# Patient Record
Sex: Male | Born: 1940
Health system: Southern US, Community
[De-identification: ages and names within clinical notes are randomized; demographics above are authoritative.]

## PROBLEM LIST (undated history)

## (undated) DIAGNOSIS — M109 Gout, unspecified: Secondary | ICD-10-CM

## (undated) DIAGNOSIS — I10 Essential (primary) hypertension: Secondary | ICD-10-CM

## (undated) HISTORY — PX: TONSILLECTOMY: SUR1361

---

## 2005-02-25 ENCOUNTER — Encounter: Admission: RE | Admit: 2005-02-25 | Discharge: 2005-02-25 | Payer: Self-pay | Admitting: Internal Medicine

## 2005-03-08 ENCOUNTER — Encounter: Admission: RE | Admit: 2005-03-08 | Discharge: 2005-03-08 | Payer: Self-pay | Admitting: Internal Medicine

## 2005-06-16 ENCOUNTER — Encounter: Admission: RE | Admit: 2005-06-16 | Discharge: 2005-06-16 | Payer: Self-pay | Admitting: Internal Medicine

## 2005-07-13 ENCOUNTER — Ambulatory Visit: Payer: Self-pay | Admitting: Gastroenterology

## 2005-07-25 ENCOUNTER — Ambulatory Visit: Payer: Self-pay | Admitting: Gastroenterology

## 2005-12-02 ENCOUNTER — Encounter: Admission: RE | Admit: 2005-12-02 | Discharge: 2005-12-02 | Payer: Self-pay | Admitting: Orthopedic Surgery

## 2009-05-08 ENCOUNTER — Encounter: Admission: RE | Admit: 2009-05-08 | Discharge: 2009-05-08 | Payer: Self-pay | Admitting: Orthopedic Surgery

## 2009-05-09 ENCOUNTER — Encounter: Admission: RE | Admit: 2009-05-09 | Discharge: 2009-05-09 | Payer: Self-pay | Admitting: Orthopedic Surgery

## 2009-07-15 ENCOUNTER — Encounter: Admission: RE | Admit: 2009-07-15 | Discharge: 2009-07-15 | Payer: Self-pay | Admitting: Internal Medicine

## 2011-04-01 ENCOUNTER — Encounter: Payer: Self-pay | Admitting: Gastroenterology

## 2011-04-20 ENCOUNTER — Other Ambulatory Visit: Payer: Self-pay | Admitting: Gastroenterology

## 2011-10-03 DIAGNOSIS — M79609 Pain in unspecified limb: Secondary | ICD-10-CM | POA: Diagnosis not present

## 2011-11-11 DIAGNOSIS — M25559 Pain in unspecified hip: Secondary | ICD-10-CM | POA: Diagnosis not present

## 2011-12-20 DIAGNOSIS — M25559 Pain in unspecified hip: Secondary | ICD-10-CM | POA: Diagnosis not present

## 2011-12-28 DIAGNOSIS — M76899 Other specified enthesopathies of unspecified lower limb, excluding foot: Secondary | ICD-10-CM | POA: Diagnosis not present

## 2012-01-19 DIAGNOSIS — H4011X0 Primary open-angle glaucoma, stage unspecified: Secondary | ICD-10-CM | POA: Diagnosis not present

## 2012-01-19 DIAGNOSIS — H409 Unspecified glaucoma: Secondary | ICD-10-CM | POA: Diagnosis not present

## 2012-02-03 DIAGNOSIS — M109 Gout, unspecified: Secondary | ICD-10-CM | POA: Diagnosis not present

## 2012-02-03 DIAGNOSIS — I10 Essential (primary) hypertension: Secondary | ICD-10-CM | POA: Diagnosis not present

## 2012-02-03 DIAGNOSIS — E785 Hyperlipidemia, unspecified: Secondary | ICD-10-CM | POA: Diagnosis not present

## 2012-02-03 DIAGNOSIS — Z125 Encounter for screening for malignant neoplasm of prostate: Secondary | ICD-10-CM | POA: Diagnosis not present

## 2012-02-06 DIAGNOSIS — I10 Essential (primary) hypertension: Secondary | ICD-10-CM | POA: Diagnosis not present

## 2012-02-06 DIAGNOSIS — L578 Other skin changes due to chronic exposure to nonionizing radiation: Secondary | ICD-10-CM | POA: Diagnosis not present

## 2012-02-06 DIAGNOSIS — L821 Other seborrheic keratosis: Secondary | ICD-10-CM | POA: Diagnosis not present

## 2012-02-06 DIAGNOSIS — L570 Actinic keratosis: Secondary | ICD-10-CM | POA: Diagnosis not present

## 2012-02-06 DIAGNOSIS — B079 Viral wart, unspecified: Secondary | ICD-10-CM | POA: Diagnosis not present

## 2012-02-06 DIAGNOSIS — D239 Other benign neoplasm of skin, unspecified: Secondary | ICD-10-CM | POA: Diagnosis not present

## 2012-02-10 DIAGNOSIS — Z0000 Encounter for general adult medical examination without abnormal findings: Secondary | ICD-10-CM | POA: Diagnosis not present

## 2012-02-10 DIAGNOSIS — R7989 Other specified abnormal findings of blood chemistry: Secondary | ICD-10-CM | POA: Diagnosis not present

## 2012-02-10 DIAGNOSIS — D239 Other benign neoplasm of skin, unspecified: Secondary | ICD-10-CM | POA: Diagnosis not present

## 2012-02-10 DIAGNOSIS — I10 Essential (primary) hypertension: Secondary | ICD-10-CM | POA: Diagnosis not present

## 2012-02-10 DIAGNOSIS — I4510 Unspecified right bundle-branch block: Secondary | ICD-10-CM | POA: Diagnosis not present

## 2012-02-10 DIAGNOSIS — M1990 Unspecified osteoarthritis, unspecified site: Secondary | ICD-10-CM | POA: Diagnosis not present

## 2012-02-10 DIAGNOSIS — N401 Enlarged prostate with lower urinary tract symptoms: Secondary | ICD-10-CM | POA: Diagnosis not present

## 2012-02-10 DIAGNOSIS — Z125 Encounter for screening for malignant neoplasm of prostate: Secondary | ICD-10-CM | POA: Diagnosis not present

## 2012-06-14 DIAGNOSIS — M109 Gout, unspecified: Secondary | ICD-10-CM | POA: Diagnosis not present

## 2012-06-14 DIAGNOSIS — N529 Male erectile dysfunction, unspecified: Secondary | ICD-10-CM | POA: Diagnosis not present

## 2012-06-14 DIAGNOSIS — E291 Testicular hypofunction: Secondary | ICD-10-CM | POA: Diagnosis not present

## 2012-06-14 DIAGNOSIS — Z23 Encounter for immunization: Secondary | ICD-10-CM | POA: Diagnosis not present

## 2012-08-14 DIAGNOSIS — Z1331 Encounter for screening for depression: Secondary | ICD-10-CM | POA: Diagnosis not present

## 2012-08-14 DIAGNOSIS — K219 Gastro-esophageal reflux disease without esophagitis: Secondary | ICD-10-CM | POA: Diagnosis not present

## 2012-08-14 DIAGNOSIS — M1990 Unspecified osteoarthritis, unspecified site: Secondary | ICD-10-CM | POA: Diagnosis not present

## 2012-08-14 DIAGNOSIS — N529 Male erectile dysfunction, unspecified: Secondary | ICD-10-CM | POA: Diagnosis not present

## 2012-09-03 DIAGNOSIS — H2510 Age-related nuclear cataract, unspecified eye: Secondary | ICD-10-CM | POA: Diagnosis not present

## 2012-11-16 DIAGNOSIS — I10 Essential (primary) hypertension: Secondary | ICD-10-CM | POA: Diagnosis not present

## 2012-11-16 DIAGNOSIS — N401 Enlarged prostate with lower urinary tract symptoms: Secondary | ICD-10-CM | POA: Diagnosis not present

## 2012-11-16 DIAGNOSIS — Z733 Stress, not elsewhere classified: Secondary | ICD-10-CM | POA: Diagnosis not present

## 2012-11-16 DIAGNOSIS — R5381 Other malaise: Secondary | ICD-10-CM | POA: Diagnosis not present

## 2012-12-10 DIAGNOSIS — D239 Other benign neoplasm of skin, unspecified: Secondary | ICD-10-CM | POA: Diagnosis not present

## 2012-12-10 DIAGNOSIS — L821 Other seborrheic keratosis: Secondary | ICD-10-CM | POA: Diagnosis not present

## 2012-12-10 DIAGNOSIS — L723 Sebaceous cyst: Secondary | ICD-10-CM | POA: Diagnosis not present

## 2012-12-10 DIAGNOSIS — L819 Disorder of pigmentation, unspecified: Secondary | ICD-10-CM | POA: Diagnosis not present

## 2012-12-10 DIAGNOSIS — Z85828 Personal history of other malignant neoplasm of skin: Secondary | ICD-10-CM | POA: Diagnosis not present

## 2012-12-10 DIAGNOSIS — D1801 Hemangioma of skin and subcutaneous tissue: Secondary | ICD-10-CM | POA: Diagnosis not present

## 2012-12-10 DIAGNOSIS — L739 Follicular disorder, unspecified: Secondary | ICD-10-CM | POA: Diagnosis not present

## 2012-12-10 DIAGNOSIS — L259 Unspecified contact dermatitis, unspecified cause: Secondary | ICD-10-CM | POA: Diagnosis not present

## 2013-01-29 DIAGNOSIS — N529 Male erectile dysfunction, unspecified: Secondary | ICD-10-CM | POA: Diagnosis not present

## 2013-02-04 DIAGNOSIS — E785 Hyperlipidemia, unspecified: Secondary | ICD-10-CM | POA: Diagnosis not present

## 2013-02-04 DIAGNOSIS — M109 Gout, unspecified: Secondary | ICD-10-CM | POA: Diagnosis not present

## 2013-02-04 DIAGNOSIS — I10 Essential (primary) hypertension: Secondary | ICD-10-CM | POA: Diagnosis not present

## 2013-02-04 DIAGNOSIS — N529 Male erectile dysfunction, unspecified: Secondary | ICD-10-CM | POA: Diagnosis not present

## 2013-02-04 DIAGNOSIS — Z125 Encounter for screening for malignant neoplasm of prostate: Secondary | ICD-10-CM | POA: Diagnosis not present

## 2013-02-13 DIAGNOSIS — Z125 Encounter for screening for malignant neoplasm of prostate: Secondary | ICD-10-CM | POA: Diagnosis not present

## 2013-02-13 DIAGNOSIS — Z0000 Encounter for general adult medical examination without abnormal findings: Secondary | ICD-10-CM | POA: Diagnosis not present

## 2013-02-13 DIAGNOSIS — M1990 Unspecified osteoarthritis, unspecified site: Secondary | ICD-10-CM | POA: Diagnosis not present

## 2013-02-13 DIAGNOSIS — N529 Male erectile dysfunction, unspecified: Secondary | ICD-10-CM | POA: Diagnosis not present

## 2013-02-13 DIAGNOSIS — M109 Gout, unspecified: Secondary | ICD-10-CM | POA: Diagnosis not present

## 2013-02-13 DIAGNOSIS — E785 Hyperlipidemia, unspecified: Secondary | ICD-10-CM | POA: Diagnosis not present

## 2013-02-13 DIAGNOSIS — K219 Gastro-esophageal reflux disease without esophagitis: Secondary | ICD-10-CM | POA: Diagnosis not present

## 2013-02-13 DIAGNOSIS — I10 Essential (primary) hypertension: Secondary | ICD-10-CM | POA: Diagnosis not present

## 2013-02-26 DIAGNOSIS — N529 Male erectile dysfunction, unspecified: Secondary | ICD-10-CM | POA: Diagnosis not present

## 2013-03-11 DIAGNOSIS — H409 Unspecified glaucoma: Secondary | ICD-10-CM | POA: Diagnosis not present

## 2013-03-11 DIAGNOSIS — H4011X0 Primary open-angle glaucoma, stage unspecified: Secondary | ICD-10-CM | POA: Diagnosis not present

## 2013-04-17 DIAGNOSIS — M545 Low back pain: Secondary | ICD-10-CM | POA: Diagnosis not present

## 2013-04-17 DIAGNOSIS — M9981 Other biomechanical lesions of cervical region: Secondary | ICD-10-CM | POA: Diagnosis not present

## 2013-04-17 DIAGNOSIS — M542 Cervicalgia: Secondary | ICD-10-CM | POA: Diagnosis not present

## 2013-04-17 DIAGNOSIS — M999 Biomechanical lesion, unspecified: Secondary | ICD-10-CM | POA: Diagnosis not present

## 2013-04-18 DIAGNOSIS — M542 Cervicalgia: Secondary | ICD-10-CM | POA: Diagnosis not present

## 2013-04-18 DIAGNOSIS — M9981 Other biomechanical lesions of cervical region: Secondary | ICD-10-CM | POA: Diagnosis not present

## 2013-04-18 DIAGNOSIS — M545 Low back pain: Secondary | ICD-10-CM | POA: Diagnosis not present

## 2013-04-18 DIAGNOSIS — M999 Biomechanical lesion, unspecified: Secondary | ICD-10-CM | POA: Diagnosis not present

## 2013-04-22 DIAGNOSIS — M999 Biomechanical lesion, unspecified: Secondary | ICD-10-CM | POA: Diagnosis not present

## 2013-04-22 DIAGNOSIS — M542 Cervicalgia: Secondary | ICD-10-CM | POA: Diagnosis not present

## 2013-04-22 DIAGNOSIS — M9981 Other biomechanical lesions of cervical region: Secondary | ICD-10-CM | POA: Diagnosis not present

## 2013-04-22 DIAGNOSIS — M545 Low back pain: Secondary | ICD-10-CM | POA: Diagnosis not present

## 2013-04-25 DIAGNOSIS — M545 Low back pain: Secondary | ICD-10-CM | POA: Diagnosis not present

## 2013-04-25 DIAGNOSIS — M542 Cervicalgia: Secondary | ICD-10-CM | POA: Diagnosis not present

## 2013-04-25 DIAGNOSIS — M999 Biomechanical lesion, unspecified: Secondary | ICD-10-CM | POA: Diagnosis not present

## 2013-04-25 DIAGNOSIS — M9981 Other biomechanical lesions of cervical region: Secondary | ICD-10-CM | POA: Diagnosis not present

## 2013-04-29 DIAGNOSIS — H4011X0 Primary open-angle glaucoma, stage unspecified: Secondary | ICD-10-CM | POA: Diagnosis not present

## 2013-04-29 DIAGNOSIS — H409 Unspecified glaucoma: Secondary | ICD-10-CM | POA: Diagnosis not present

## 2013-04-29 DIAGNOSIS — H2510 Age-related nuclear cataract, unspecified eye: Secondary | ICD-10-CM | POA: Diagnosis not present

## 2013-04-30 DIAGNOSIS — M542 Cervicalgia: Secondary | ICD-10-CM | POA: Diagnosis not present

## 2013-04-30 DIAGNOSIS — M9981 Other biomechanical lesions of cervical region: Secondary | ICD-10-CM | POA: Diagnosis not present

## 2013-04-30 DIAGNOSIS — N529 Male erectile dysfunction, unspecified: Secondary | ICD-10-CM | POA: Diagnosis not present

## 2013-04-30 DIAGNOSIS — M999 Biomechanical lesion, unspecified: Secondary | ICD-10-CM | POA: Diagnosis not present

## 2013-04-30 DIAGNOSIS — M545 Low back pain: Secondary | ICD-10-CM | POA: Diagnosis not present

## 2013-04-30 DIAGNOSIS — N401 Enlarged prostate with lower urinary tract symptoms: Secondary | ICD-10-CM | POA: Diagnosis not present

## 2013-05-02 DIAGNOSIS — M999 Biomechanical lesion, unspecified: Secondary | ICD-10-CM | POA: Diagnosis not present

## 2013-05-02 DIAGNOSIS — M545 Low back pain: Secondary | ICD-10-CM | POA: Diagnosis not present

## 2013-05-02 DIAGNOSIS — M9981 Other biomechanical lesions of cervical region: Secondary | ICD-10-CM | POA: Diagnosis not present

## 2013-05-02 DIAGNOSIS — M542 Cervicalgia: Secondary | ICD-10-CM | POA: Diagnosis not present

## 2013-05-06 DIAGNOSIS — M545 Low back pain: Secondary | ICD-10-CM | POA: Diagnosis not present

## 2013-05-06 DIAGNOSIS — M9981 Other biomechanical lesions of cervical region: Secondary | ICD-10-CM | POA: Diagnosis not present

## 2013-05-06 DIAGNOSIS — M542 Cervicalgia: Secondary | ICD-10-CM | POA: Diagnosis not present

## 2013-05-06 DIAGNOSIS — M999 Biomechanical lesion, unspecified: Secondary | ICD-10-CM | POA: Diagnosis not present

## 2013-05-09 DIAGNOSIS — M545 Low back pain: Secondary | ICD-10-CM | POA: Diagnosis not present

## 2013-05-09 DIAGNOSIS — M999 Biomechanical lesion, unspecified: Secondary | ICD-10-CM | POA: Diagnosis not present

## 2013-05-09 DIAGNOSIS — M9981 Other biomechanical lesions of cervical region: Secondary | ICD-10-CM | POA: Diagnosis not present

## 2013-05-09 DIAGNOSIS — M542 Cervicalgia: Secondary | ICD-10-CM | POA: Diagnosis not present

## 2013-05-13 DIAGNOSIS — M542 Cervicalgia: Secondary | ICD-10-CM | POA: Diagnosis not present

## 2013-05-13 DIAGNOSIS — M545 Low back pain: Secondary | ICD-10-CM | POA: Diagnosis not present

## 2013-05-13 DIAGNOSIS — M999 Biomechanical lesion, unspecified: Secondary | ICD-10-CM | POA: Diagnosis not present

## 2013-05-13 DIAGNOSIS — M9981 Other biomechanical lesions of cervical region: Secondary | ICD-10-CM | POA: Diagnosis not present

## 2013-05-16 DIAGNOSIS — M545 Low back pain: Secondary | ICD-10-CM | POA: Diagnosis not present

## 2013-05-16 DIAGNOSIS — M9981 Other biomechanical lesions of cervical region: Secondary | ICD-10-CM | POA: Diagnosis not present

## 2013-05-16 DIAGNOSIS — M542 Cervicalgia: Secondary | ICD-10-CM | POA: Diagnosis not present

## 2013-05-16 DIAGNOSIS — M999 Biomechanical lesion, unspecified: Secondary | ICD-10-CM | POA: Diagnosis not present

## 2013-05-21 DIAGNOSIS — M999 Biomechanical lesion, unspecified: Secondary | ICD-10-CM | POA: Diagnosis not present

## 2013-05-21 DIAGNOSIS — M9981 Other biomechanical lesions of cervical region: Secondary | ICD-10-CM | POA: Diagnosis not present

## 2013-05-21 DIAGNOSIS — M545 Low back pain: Secondary | ICD-10-CM | POA: Diagnosis not present

## 2013-05-21 DIAGNOSIS — M542 Cervicalgia: Secondary | ICD-10-CM | POA: Diagnosis not present

## 2013-05-23 DIAGNOSIS — M999 Biomechanical lesion, unspecified: Secondary | ICD-10-CM | POA: Diagnosis not present

## 2013-05-23 DIAGNOSIS — M545 Low back pain: Secondary | ICD-10-CM | POA: Diagnosis not present

## 2013-05-23 DIAGNOSIS — M542 Cervicalgia: Secondary | ICD-10-CM | POA: Diagnosis not present

## 2013-05-23 DIAGNOSIS — M9981 Other biomechanical lesions of cervical region: Secondary | ICD-10-CM | POA: Diagnosis not present

## 2013-05-27 DIAGNOSIS — M9981 Other biomechanical lesions of cervical region: Secondary | ICD-10-CM | POA: Diagnosis not present

## 2013-05-27 DIAGNOSIS — M542 Cervicalgia: Secondary | ICD-10-CM | POA: Diagnosis not present

## 2013-05-27 DIAGNOSIS — M999 Biomechanical lesion, unspecified: Secondary | ICD-10-CM | POA: Diagnosis not present

## 2013-05-27 DIAGNOSIS — M545 Low back pain: Secondary | ICD-10-CM | POA: Diagnosis not present

## 2013-05-30 DIAGNOSIS — M545 Low back pain: Secondary | ICD-10-CM | POA: Diagnosis not present

## 2013-05-30 DIAGNOSIS — M999 Biomechanical lesion, unspecified: Secondary | ICD-10-CM | POA: Diagnosis not present

## 2013-05-30 DIAGNOSIS — M9981 Other biomechanical lesions of cervical region: Secondary | ICD-10-CM | POA: Diagnosis not present

## 2013-05-30 DIAGNOSIS — M542 Cervicalgia: Secondary | ICD-10-CM | POA: Diagnosis not present

## 2013-06-03 DIAGNOSIS — M999 Biomechanical lesion, unspecified: Secondary | ICD-10-CM | POA: Diagnosis not present

## 2013-06-03 DIAGNOSIS — M542 Cervicalgia: Secondary | ICD-10-CM | POA: Diagnosis not present

## 2013-06-03 DIAGNOSIS — M545 Low back pain: Secondary | ICD-10-CM | POA: Diagnosis not present

## 2013-06-03 DIAGNOSIS — M9981 Other biomechanical lesions of cervical region: Secondary | ICD-10-CM | POA: Diagnosis not present

## 2013-06-06 DIAGNOSIS — M999 Biomechanical lesion, unspecified: Secondary | ICD-10-CM | POA: Diagnosis not present

## 2013-06-06 DIAGNOSIS — M542 Cervicalgia: Secondary | ICD-10-CM | POA: Diagnosis not present

## 2013-06-06 DIAGNOSIS — M9981 Other biomechanical lesions of cervical region: Secondary | ICD-10-CM | POA: Diagnosis not present

## 2013-06-06 DIAGNOSIS — M545 Low back pain: Secondary | ICD-10-CM | POA: Diagnosis not present

## 2013-06-10 DIAGNOSIS — M999 Biomechanical lesion, unspecified: Secondary | ICD-10-CM | POA: Diagnosis not present

## 2013-06-10 DIAGNOSIS — M542 Cervicalgia: Secondary | ICD-10-CM | POA: Diagnosis not present

## 2013-06-10 DIAGNOSIS — M545 Low back pain: Secondary | ICD-10-CM | POA: Diagnosis not present

## 2013-06-10 DIAGNOSIS — M9981 Other biomechanical lesions of cervical region: Secondary | ICD-10-CM | POA: Diagnosis not present

## 2013-06-13 DIAGNOSIS — M9981 Other biomechanical lesions of cervical region: Secondary | ICD-10-CM | POA: Diagnosis not present

## 2013-06-13 DIAGNOSIS — M999 Biomechanical lesion, unspecified: Secondary | ICD-10-CM | POA: Diagnosis not present

## 2013-06-13 DIAGNOSIS — M542 Cervicalgia: Secondary | ICD-10-CM | POA: Diagnosis not present

## 2013-06-13 DIAGNOSIS — M545 Low back pain: Secondary | ICD-10-CM | POA: Diagnosis not present

## 2013-06-20 DIAGNOSIS — M47812 Spondylosis without myelopathy or radiculopathy, cervical region: Secondary | ICD-10-CM | POA: Diagnosis not present

## 2013-06-20 DIAGNOSIS — M9981 Other biomechanical lesions of cervical region: Secondary | ICD-10-CM | POA: Diagnosis not present

## 2013-06-20 DIAGNOSIS — M542 Cervicalgia: Secondary | ICD-10-CM | POA: Diagnosis not present

## 2013-06-27 DIAGNOSIS — M9981 Other biomechanical lesions of cervical region: Secondary | ICD-10-CM | POA: Diagnosis not present

## 2013-06-27 DIAGNOSIS — M47812 Spondylosis without myelopathy or radiculopathy, cervical region: Secondary | ICD-10-CM | POA: Diagnosis not present

## 2013-06-27 DIAGNOSIS — M542 Cervicalgia: Secondary | ICD-10-CM | POA: Diagnosis not present

## 2013-07-03 DIAGNOSIS — M1990 Unspecified osteoarthritis, unspecified site: Secondary | ICD-10-CM | POA: Diagnosis not present

## 2013-07-03 DIAGNOSIS — M6740 Ganglion, unspecified site: Secondary | ICD-10-CM | POA: Diagnosis not present

## 2013-07-04 DIAGNOSIS — M47812 Spondylosis without myelopathy or radiculopathy, cervical region: Secondary | ICD-10-CM | POA: Diagnosis not present

## 2013-07-04 DIAGNOSIS — M542 Cervicalgia: Secondary | ICD-10-CM | POA: Diagnosis not present

## 2013-07-04 DIAGNOSIS — M9981 Other biomechanical lesions of cervical region: Secondary | ICD-10-CM | POA: Diagnosis not present

## 2013-07-11 DIAGNOSIS — M47812 Spondylosis without myelopathy or radiculopathy, cervical region: Secondary | ICD-10-CM | POA: Diagnosis not present

## 2013-07-11 DIAGNOSIS — M542 Cervicalgia: Secondary | ICD-10-CM | POA: Diagnosis not present

## 2013-07-11 DIAGNOSIS — M9981 Other biomechanical lesions of cervical region: Secondary | ICD-10-CM | POA: Diagnosis not present

## 2013-07-23 DIAGNOSIS — M542 Cervicalgia: Secondary | ICD-10-CM | POA: Diagnosis not present

## 2013-07-23 DIAGNOSIS — M47812 Spondylosis without myelopathy or radiculopathy, cervical region: Secondary | ICD-10-CM | POA: Diagnosis not present

## 2013-07-23 DIAGNOSIS — M9981 Other biomechanical lesions of cervical region: Secondary | ICD-10-CM | POA: Diagnosis not present

## 2013-08-07 DIAGNOSIS — N139 Obstructive and reflux uropathy, unspecified: Secondary | ICD-10-CM | POA: Diagnosis not present

## 2013-08-07 DIAGNOSIS — R351 Nocturia: Secondary | ICD-10-CM | POA: Diagnosis not present

## 2013-08-07 DIAGNOSIS — R3915 Urgency of urination: Secondary | ICD-10-CM | POA: Diagnosis not present

## 2013-08-07 DIAGNOSIS — N401 Enlarged prostate with lower urinary tract symptoms: Secondary | ICD-10-CM | POA: Diagnosis not present

## 2013-08-08 DIAGNOSIS — M542 Cervicalgia: Secondary | ICD-10-CM | POA: Diagnosis not present

## 2013-08-08 DIAGNOSIS — M47812 Spondylosis without myelopathy or radiculopathy, cervical region: Secondary | ICD-10-CM | POA: Diagnosis not present

## 2013-08-08 DIAGNOSIS — M9981 Other biomechanical lesions of cervical region: Secondary | ICD-10-CM | POA: Diagnosis not present

## 2013-08-19 DIAGNOSIS — M9981 Other biomechanical lesions of cervical region: Secondary | ICD-10-CM | POA: Diagnosis not present

## 2013-08-19 DIAGNOSIS — M47812 Spondylosis without myelopathy or radiculopathy, cervical region: Secondary | ICD-10-CM | POA: Diagnosis not present

## 2013-08-19 DIAGNOSIS — M542 Cervicalgia: Secondary | ICD-10-CM | POA: Diagnosis not present

## 2013-08-20 DIAGNOSIS — M1990 Unspecified osteoarthritis, unspecified site: Secondary | ICD-10-CM | POA: Diagnosis not present

## 2013-08-20 DIAGNOSIS — Z6831 Body mass index (BMI) 31.0-31.9, adult: Secondary | ICD-10-CM | POA: Diagnosis not present

## 2013-08-20 DIAGNOSIS — I10 Essential (primary) hypertension: Secondary | ICD-10-CM | POA: Diagnosis not present

## 2013-08-20 DIAGNOSIS — K219 Gastro-esophageal reflux disease without esophagitis: Secondary | ICD-10-CM | POA: Diagnosis not present

## 2013-08-20 DIAGNOSIS — G5600 Carpal tunnel syndrome, unspecified upper limb: Secondary | ICD-10-CM | POA: Diagnosis not present

## 2013-08-20 DIAGNOSIS — E785 Hyperlipidemia, unspecified: Secondary | ICD-10-CM | POA: Diagnosis not present

## 2013-08-20 DIAGNOSIS — M109 Gout, unspecified: Secondary | ICD-10-CM | POA: Diagnosis not present

## 2013-08-29 DIAGNOSIS — H4011X0 Primary open-angle glaucoma, stage unspecified: Secondary | ICD-10-CM | POA: Diagnosis not present

## 2013-08-29 DIAGNOSIS — H409 Unspecified glaucoma: Secondary | ICD-10-CM | POA: Diagnosis not present

## 2013-08-29 DIAGNOSIS — H2510 Age-related nuclear cataract, unspecified eye: Secondary | ICD-10-CM | POA: Diagnosis not present

## 2013-09-02 DIAGNOSIS — M5430 Sciatica, unspecified side: Secondary | ICD-10-CM | POA: Diagnosis not present

## 2013-09-02 DIAGNOSIS — M545 Low back pain: Secondary | ICD-10-CM | POA: Diagnosis not present

## 2013-09-02 DIAGNOSIS — M999 Biomechanical lesion, unspecified: Secondary | ICD-10-CM | POA: Diagnosis not present

## 2013-09-23 DIAGNOSIS — M999 Biomechanical lesion, unspecified: Secondary | ICD-10-CM | POA: Diagnosis not present

## 2013-09-23 DIAGNOSIS — M5430 Sciatica, unspecified side: Secondary | ICD-10-CM | POA: Diagnosis not present

## 2013-09-23 DIAGNOSIS — M545 Low back pain, unspecified: Secondary | ICD-10-CM | POA: Diagnosis not present

## 2013-09-26 DIAGNOSIS — M545 Low back pain, unspecified: Secondary | ICD-10-CM | POA: Diagnosis not present

## 2013-09-26 DIAGNOSIS — M999 Biomechanical lesion, unspecified: Secondary | ICD-10-CM | POA: Diagnosis not present

## 2013-09-26 DIAGNOSIS — M5430 Sciatica, unspecified side: Secondary | ICD-10-CM | POA: Diagnosis not present

## 2013-10-10 DIAGNOSIS — M999 Biomechanical lesion, unspecified: Secondary | ICD-10-CM | POA: Diagnosis not present

## 2013-10-10 DIAGNOSIS — M545 Low back pain, unspecified: Secondary | ICD-10-CM | POA: Diagnosis not present

## 2013-10-10 DIAGNOSIS — M5430 Sciatica, unspecified side: Secondary | ICD-10-CM | POA: Diagnosis not present

## 2013-11-28 DIAGNOSIS — M542 Cervicalgia: Secondary | ICD-10-CM | POA: Diagnosis not present

## 2013-11-28 DIAGNOSIS — M9981 Other biomechanical lesions of cervical region: Secondary | ICD-10-CM | POA: Diagnosis not present

## 2013-11-28 DIAGNOSIS — M5030 Other cervical disc degeneration, unspecified cervical region: Secondary | ICD-10-CM | POA: Diagnosis not present

## 2013-11-29 DIAGNOSIS — N139 Obstructive and reflux uropathy, unspecified: Secondary | ICD-10-CM | POA: Diagnosis not present

## 2013-11-29 DIAGNOSIS — N401 Enlarged prostate with lower urinary tract symptoms: Secondary | ICD-10-CM | POA: Diagnosis not present

## 2013-12-05 DIAGNOSIS — M9981 Other biomechanical lesions of cervical region: Secondary | ICD-10-CM | POA: Diagnosis not present

## 2013-12-05 DIAGNOSIS — M5030 Other cervical disc degeneration, unspecified cervical region: Secondary | ICD-10-CM | POA: Diagnosis not present

## 2013-12-05 DIAGNOSIS — M542 Cervicalgia: Secondary | ICD-10-CM | POA: Diagnosis not present

## 2013-12-11 DIAGNOSIS — L219 Seborrheic dermatitis, unspecified: Secondary | ICD-10-CM | POA: Diagnosis not present

## 2013-12-11 DIAGNOSIS — L723 Sebaceous cyst: Secondary | ICD-10-CM | POA: Diagnosis not present

## 2013-12-11 DIAGNOSIS — Z85828 Personal history of other malignant neoplasm of skin: Secondary | ICD-10-CM | POA: Diagnosis not present

## 2013-12-11 DIAGNOSIS — D485 Neoplasm of uncertain behavior of skin: Secondary | ICD-10-CM | POA: Diagnosis not present

## 2013-12-11 DIAGNOSIS — L570 Actinic keratosis: Secondary | ICD-10-CM | POA: Diagnosis not present

## 2013-12-11 DIAGNOSIS — D239 Other benign neoplasm of skin, unspecified: Secondary | ICD-10-CM | POA: Diagnosis not present

## 2013-12-11 DIAGNOSIS — L821 Other seborrheic keratosis: Secondary | ICD-10-CM | POA: Diagnosis not present

## 2013-12-12 DIAGNOSIS — M5030 Other cervical disc degeneration, unspecified cervical region: Secondary | ICD-10-CM | POA: Diagnosis not present

## 2013-12-12 DIAGNOSIS — M9981 Other biomechanical lesions of cervical region: Secondary | ICD-10-CM | POA: Diagnosis not present

## 2013-12-12 DIAGNOSIS — M542 Cervicalgia: Secondary | ICD-10-CM | POA: Diagnosis not present

## 2014-01-09 DIAGNOSIS — H4011X0 Primary open-angle glaucoma, stage unspecified: Secondary | ICD-10-CM | POA: Diagnosis not present

## 2014-01-09 DIAGNOSIS — H409 Unspecified glaucoma: Secondary | ICD-10-CM | POA: Diagnosis not present

## 2014-01-10 DIAGNOSIS — R351 Nocturia: Secondary | ICD-10-CM | POA: Diagnosis not present

## 2014-01-10 DIAGNOSIS — N401 Enlarged prostate with lower urinary tract symptoms: Secondary | ICD-10-CM | POA: Diagnosis not present

## 2014-01-10 DIAGNOSIS — R3915 Urgency of urination: Secondary | ICD-10-CM | POA: Diagnosis not present

## 2014-02-17 DIAGNOSIS — N401 Enlarged prostate with lower urinary tract symptoms: Secondary | ICD-10-CM | POA: Diagnosis not present

## 2014-02-17 DIAGNOSIS — I10 Essential (primary) hypertension: Secondary | ICD-10-CM | POA: Diagnosis not present

## 2014-02-17 DIAGNOSIS — N138 Other obstructive and reflux uropathy: Secondary | ICD-10-CM | POA: Diagnosis not present

## 2014-02-17 DIAGNOSIS — Z125 Encounter for screening for malignant neoplasm of prostate: Secondary | ICD-10-CM | POA: Diagnosis not present

## 2014-02-17 DIAGNOSIS — M109 Gout, unspecified: Secondary | ICD-10-CM | POA: Diagnosis not present

## 2014-02-17 DIAGNOSIS — E785 Hyperlipidemia, unspecified: Secondary | ICD-10-CM | POA: Diagnosis not present

## 2014-02-21 DIAGNOSIS — E785 Hyperlipidemia, unspecified: Secondary | ICD-10-CM | POA: Diagnosis not present

## 2014-02-21 DIAGNOSIS — Z1331 Encounter for screening for depression: Secondary | ICD-10-CM | POA: Diagnosis not present

## 2014-02-21 DIAGNOSIS — Z0000 Encounter for general adult medical examination without abnormal findings: Secondary | ICD-10-CM | POA: Diagnosis not present

## 2014-02-21 DIAGNOSIS — I10 Essential (primary) hypertension: Secondary | ICD-10-CM | POA: Diagnosis not present

## 2014-02-21 DIAGNOSIS — Z23 Encounter for immunization: Secondary | ICD-10-CM | POA: Diagnosis not present

## 2014-02-21 DIAGNOSIS — M1990 Unspecified osteoarthritis, unspecified site: Secondary | ICD-10-CM | POA: Diagnosis not present

## 2014-02-21 DIAGNOSIS — M109 Gout, unspecified: Secondary | ICD-10-CM | POA: Diagnosis not present

## 2014-02-21 DIAGNOSIS — K219 Gastro-esophageal reflux disease without esophagitis: Secondary | ICD-10-CM | POA: Diagnosis not present

## 2014-02-21 DIAGNOSIS — N529 Male erectile dysfunction, unspecified: Secondary | ICD-10-CM | POA: Diagnosis not present

## 2014-02-21 DIAGNOSIS — I4510 Unspecified right bundle-branch block: Secondary | ICD-10-CM | POA: Diagnosis not present

## 2014-03-12 DIAGNOSIS — H409 Unspecified glaucoma: Secondary | ICD-10-CM | POA: Diagnosis not present

## 2014-03-12 DIAGNOSIS — H4011X0 Primary open-angle glaucoma, stage unspecified: Secondary | ICD-10-CM | POA: Diagnosis not present

## 2014-03-12 DIAGNOSIS — H2510 Age-related nuclear cataract, unspecified eye: Secondary | ICD-10-CM | POA: Diagnosis not present

## 2014-03-19 DIAGNOSIS — Z0000 Encounter for general adult medical examination without abnormal findings: Secondary | ICD-10-CM | POA: Diagnosis not present

## 2014-06-09 DIAGNOSIS — R350 Frequency of micturition: Secondary | ICD-10-CM | POA: Diagnosis not present

## 2014-06-09 DIAGNOSIS — N529 Male erectile dysfunction, unspecified: Secondary | ICD-10-CM | POA: Diagnosis not present

## 2014-06-09 DIAGNOSIS — R351 Nocturia: Secondary | ICD-10-CM | POA: Diagnosis not present

## 2014-06-09 DIAGNOSIS — R3915 Urgency of urination: Secondary | ICD-10-CM | POA: Diagnosis not present

## 2014-06-09 DIAGNOSIS — N401 Enlarged prostate with lower urinary tract symptoms: Secondary | ICD-10-CM | POA: Diagnosis not present

## 2014-06-12 DIAGNOSIS — H4011X0 Primary open-angle glaucoma, stage unspecified: Secondary | ICD-10-CM | POA: Diagnosis not present

## 2014-06-12 DIAGNOSIS — H2510 Age-related nuclear cataract, unspecified eye: Secondary | ICD-10-CM | POA: Diagnosis not present

## 2014-06-12 DIAGNOSIS — H409 Unspecified glaucoma: Secondary | ICD-10-CM | POA: Diagnosis not present

## 2014-06-24 DIAGNOSIS — R3915 Urgency of urination: Secondary | ICD-10-CM | POA: Diagnosis not present

## 2014-06-24 DIAGNOSIS — R351 Nocturia: Secondary | ICD-10-CM | POA: Diagnosis not present

## 2014-07-15 DIAGNOSIS — R351 Nocturia: Secondary | ICD-10-CM | POA: Diagnosis not present

## 2014-07-15 DIAGNOSIS — R3915 Urgency of urination: Secondary | ICD-10-CM | POA: Diagnosis not present

## 2014-07-15 DIAGNOSIS — R350 Frequency of micturition: Secondary | ICD-10-CM | POA: Diagnosis not present

## 2014-07-15 DIAGNOSIS — N401 Enlarged prostate with lower urinary tract symptoms: Secondary | ICD-10-CM | POA: Diagnosis not present

## 2014-07-22 DIAGNOSIS — M9903 Segmental and somatic dysfunction of lumbar region: Secondary | ICD-10-CM | POA: Diagnosis not present

## 2014-07-22 DIAGNOSIS — M5442 Lumbago with sciatica, left side: Secondary | ICD-10-CM | POA: Diagnosis not present

## 2014-07-24 DIAGNOSIS — M9901 Segmental and somatic dysfunction of cervical region: Secondary | ICD-10-CM | POA: Diagnosis not present

## 2014-07-24 DIAGNOSIS — M542 Cervicalgia: Secondary | ICD-10-CM | POA: Diagnosis not present

## 2014-07-24 DIAGNOSIS — M5442 Lumbago with sciatica, left side: Secondary | ICD-10-CM | POA: Diagnosis not present

## 2014-07-24 DIAGNOSIS — M9903 Segmental and somatic dysfunction of lumbar region: Secondary | ICD-10-CM | POA: Diagnosis not present

## 2014-07-25 DIAGNOSIS — M542 Cervicalgia: Secondary | ICD-10-CM | POA: Diagnosis not present

## 2014-07-25 DIAGNOSIS — M9901 Segmental and somatic dysfunction of cervical region: Secondary | ICD-10-CM | POA: Diagnosis not present

## 2014-07-25 DIAGNOSIS — M5442 Lumbago with sciatica, left side: Secondary | ICD-10-CM | POA: Diagnosis not present

## 2014-07-25 DIAGNOSIS — M9903 Segmental and somatic dysfunction of lumbar region: Secondary | ICD-10-CM | POA: Diagnosis not present

## 2014-07-26 DIAGNOSIS — M9903 Segmental and somatic dysfunction of lumbar region: Secondary | ICD-10-CM | POA: Diagnosis not present

## 2014-07-26 DIAGNOSIS — M5442 Lumbago with sciatica, left side: Secondary | ICD-10-CM | POA: Diagnosis not present

## 2014-07-26 DIAGNOSIS — M9901 Segmental and somatic dysfunction of cervical region: Secondary | ICD-10-CM | POA: Diagnosis not present

## 2014-07-26 DIAGNOSIS — M542 Cervicalgia: Secondary | ICD-10-CM | POA: Diagnosis not present

## 2014-07-28 DIAGNOSIS — M9901 Segmental and somatic dysfunction of cervical region: Secondary | ICD-10-CM | POA: Diagnosis not present

## 2014-07-28 DIAGNOSIS — M9903 Segmental and somatic dysfunction of lumbar region: Secondary | ICD-10-CM | POA: Diagnosis not present

## 2014-07-28 DIAGNOSIS — M542 Cervicalgia: Secondary | ICD-10-CM | POA: Diagnosis not present

## 2014-07-28 DIAGNOSIS — M5442 Lumbago with sciatica, left side: Secondary | ICD-10-CM | POA: Diagnosis not present

## 2014-07-29 DIAGNOSIS — M9903 Segmental and somatic dysfunction of lumbar region: Secondary | ICD-10-CM | POA: Diagnosis not present

## 2014-07-29 DIAGNOSIS — M542 Cervicalgia: Secondary | ICD-10-CM | POA: Diagnosis not present

## 2014-07-29 DIAGNOSIS — M5442 Lumbago with sciatica, left side: Secondary | ICD-10-CM | POA: Diagnosis not present

## 2014-07-29 DIAGNOSIS — M9901 Segmental and somatic dysfunction of cervical region: Secondary | ICD-10-CM | POA: Diagnosis not present

## 2014-07-31 DIAGNOSIS — M9903 Segmental and somatic dysfunction of lumbar region: Secondary | ICD-10-CM | POA: Diagnosis not present

## 2014-07-31 DIAGNOSIS — M5442 Lumbago with sciatica, left side: Secondary | ICD-10-CM | POA: Diagnosis not present

## 2014-07-31 DIAGNOSIS — M542 Cervicalgia: Secondary | ICD-10-CM | POA: Diagnosis not present

## 2014-07-31 DIAGNOSIS — M9901 Segmental and somatic dysfunction of cervical region: Secondary | ICD-10-CM | POA: Diagnosis not present

## 2014-08-04 DIAGNOSIS — M9901 Segmental and somatic dysfunction of cervical region: Secondary | ICD-10-CM | POA: Diagnosis not present

## 2014-08-04 DIAGNOSIS — M542 Cervicalgia: Secondary | ICD-10-CM | POA: Diagnosis not present

## 2014-08-04 DIAGNOSIS — M5442 Lumbago with sciatica, left side: Secondary | ICD-10-CM | POA: Diagnosis not present

## 2014-08-04 DIAGNOSIS — M9903 Segmental and somatic dysfunction of lumbar region: Secondary | ICD-10-CM | POA: Diagnosis not present

## 2014-08-07 DIAGNOSIS — M9903 Segmental and somatic dysfunction of lumbar region: Secondary | ICD-10-CM | POA: Diagnosis not present

## 2014-08-07 DIAGNOSIS — M5442 Lumbago with sciatica, left side: Secondary | ICD-10-CM | POA: Diagnosis not present

## 2014-08-07 DIAGNOSIS — M542 Cervicalgia: Secondary | ICD-10-CM | POA: Diagnosis not present

## 2014-08-07 DIAGNOSIS — M9901 Segmental and somatic dysfunction of cervical region: Secondary | ICD-10-CM | POA: Diagnosis not present

## 2014-08-11 DIAGNOSIS — M542 Cervicalgia: Secondary | ICD-10-CM | POA: Diagnosis not present

## 2014-08-11 DIAGNOSIS — M5442 Lumbago with sciatica, left side: Secondary | ICD-10-CM | POA: Diagnosis not present

## 2014-08-11 DIAGNOSIS — M9901 Segmental and somatic dysfunction of cervical region: Secondary | ICD-10-CM | POA: Diagnosis not present

## 2014-08-11 DIAGNOSIS — M9903 Segmental and somatic dysfunction of lumbar region: Secondary | ICD-10-CM | POA: Diagnosis not present

## 2014-08-18 DIAGNOSIS — M5442 Lumbago with sciatica, left side: Secondary | ICD-10-CM | POA: Diagnosis not present

## 2014-08-18 DIAGNOSIS — M542 Cervicalgia: Secondary | ICD-10-CM | POA: Diagnosis not present

## 2014-08-18 DIAGNOSIS — M9903 Segmental and somatic dysfunction of lumbar region: Secondary | ICD-10-CM | POA: Diagnosis not present

## 2014-08-18 DIAGNOSIS — M9901 Segmental and somatic dysfunction of cervical region: Secondary | ICD-10-CM | POA: Diagnosis not present

## 2014-08-22 DIAGNOSIS — Z85828 Personal history of other malignant neoplasm of skin: Secondary | ICD-10-CM | POA: Diagnosis not present

## 2014-08-22 DIAGNOSIS — L720 Epidermal cyst: Secondary | ICD-10-CM | POA: Diagnosis not present

## 2014-08-22 DIAGNOSIS — M5442 Lumbago with sciatica, left side: Secondary | ICD-10-CM | POA: Diagnosis not present

## 2014-08-22 DIAGNOSIS — M9901 Segmental and somatic dysfunction of cervical region: Secondary | ICD-10-CM | POA: Diagnosis not present

## 2014-08-22 DIAGNOSIS — M542 Cervicalgia: Secondary | ICD-10-CM | POA: Diagnosis not present

## 2014-08-22 DIAGNOSIS — M9903 Segmental and somatic dysfunction of lumbar region: Secondary | ICD-10-CM | POA: Diagnosis not present

## 2014-08-26 DIAGNOSIS — K219 Gastro-esophageal reflux disease without esophagitis: Secondary | ICD-10-CM | POA: Diagnosis not present

## 2014-08-26 DIAGNOSIS — E785 Hyperlipidemia, unspecified: Secondary | ICD-10-CM | POA: Diagnosis not present

## 2014-08-26 DIAGNOSIS — J309 Allergic rhinitis, unspecified: Secondary | ICD-10-CM | POA: Diagnosis not present

## 2014-08-26 DIAGNOSIS — M1990 Unspecified osteoarthritis, unspecified site: Secondary | ICD-10-CM | POA: Diagnosis not present

## 2014-08-26 DIAGNOSIS — Z1389 Encounter for screening for other disorder: Secondary | ICD-10-CM | POA: Diagnosis not present

## 2014-08-26 DIAGNOSIS — M9903 Segmental and somatic dysfunction of lumbar region: Secondary | ICD-10-CM | POA: Diagnosis not present

## 2014-08-26 DIAGNOSIS — M542 Cervicalgia: Secondary | ICD-10-CM | POA: Diagnosis not present

## 2014-08-26 DIAGNOSIS — Z6832 Body mass index (BMI) 32.0-32.9, adult: Secondary | ICD-10-CM | POA: Diagnosis not present

## 2014-08-26 DIAGNOSIS — M5442 Lumbago with sciatica, left side: Secondary | ICD-10-CM | POA: Diagnosis not present

## 2014-08-26 DIAGNOSIS — I10 Essential (primary) hypertension: Secondary | ICD-10-CM | POA: Diagnosis not present

## 2014-08-26 DIAGNOSIS — M9901 Segmental and somatic dysfunction of cervical region: Secondary | ICD-10-CM | POA: Diagnosis not present

## 2014-08-28 DIAGNOSIS — M542 Cervicalgia: Secondary | ICD-10-CM | POA: Diagnosis not present

## 2014-08-28 DIAGNOSIS — M9903 Segmental and somatic dysfunction of lumbar region: Secondary | ICD-10-CM | POA: Diagnosis not present

## 2014-08-28 DIAGNOSIS — M5442 Lumbago with sciatica, left side: Secondary | ICD-10-CM | POA: Diagnosis not present

## 2014-08-28 DIAGNOSIS — M9901 Segmental and somatic dysfunction of cervical region: Secondary | ICD-10-CM | POA: Diagnosis not present

## 2014-09-02 DIAGNOSIS — M9903 Segmental and somatic dysfunction of lumbar region: Secondary | ICD-10-CM | POA: Diagnosis not present

## 2014-09-02 DIAGNOSIS — M9901 Segmental and somatic dysfunction of cervical region: Secondary | ICD-10-CM | POA: Diagnosis not present

## 2014-09-02 DIAGNOSIS — M542 Cervicalgia: Secondary | ICD-10-CM | POA: Diagnosis not present

## 2014-09-02 DIAGNOSIS — M5442 Lumbago with sciatica, left side: Secondary | ICD-10-CM | POA: Diagnosis not present

## 2014-09-04 DIAGNOSIS — M9901 Segmental and somatic dysfunction of cervical region: Secondary | ICD-10-CM | POA: Diagnosis not present

## 2014-09-04 DIAGNOSIS — M542 Cervicalgia: Secondary | ICD-10-CM | POA: Diagnosis not present

## 2014-09-04 DIAGNOSIS — M5442 Lumbago with sciatica, left side: Secondary | ICD-10-CM | POA: Diagnosis not present

## 2014-09-04 DIAGNOSIS — M9903 Segmental and somatic dysfunction of lumbar region: Secondary | ICD-10-CM | POA: Diagnosis not present

## 2014-09-09 DIAGNOSIS — M9901 Segmental and somatic dysfunction of cervical region: Secondary | ICD-10-CM | POA: Diagnosis not present

## 2014-09-09 DIAGNOSIS — M542 Cervicalgia: Secondary | ICD-10-CM | POA: Diagnosis not present

## 2014-09-09 DIAGNOSIS — M5442 Lumbago with sciatica, left side: Secondary | ICD-10-CM | POA: Diagnosis not present

## 2014-09-09 DIAGNOSIS — M9903 Segmental and somatic dysfunction of lumbar region: Secondary | ICD-10-CM | POA: Diagnosis not present

## 2014-09-16 DIAGNOSIS — M9903 Segmental and somatic dysfunction of lumbar region: Secondary | ICD-10-CM | POA: Diagnosis not present

## 2014-09-16 DIAGNOSIS — M9901 Segmental and somatic dysfunction of cervical region: Secondary | ICD-10-CM | POA: Diagnosis not present

## 2014-09-16 DIAGNOSIS — M5442 Lumbago with sciatica, left side: Secondary | ICD-10-CM | POA: Diagnosis not present

## 2014-09-16 DIAGNOSIS — M542 Cervicalgia: Secondary | ICD-10-CM | POA: Diagnosis not present

## 2014-09-24 DIAGNOSIS — Z85828 Personal history of other malignant neoplasm of skin: Secondary | ICD-10-CM | POA: Diagnosis not present

## 2014-09-24 DIAGNOSIS — L0293 Carbuncle, unspecified: Secondary | ICD-10-CM | POA: Diagnosis not present

## 2014-09-24 DIAGNOSIS — L723 Sebaceous cyst: Secondary | ICD-10-CM | POA: Diagnosis not present

## 2014-09-30 DIAGNOSIS — M9903 Segmental and somatic dysfunction of lumbar region: Secondary | ICD-10-CM | POA: Diagnosis not present

## 2014-09-30 DIAGNOSIS — M9901 Segmental and somatic dysfunction of cervical region: Secondary | ICD-10-CM | POA: Diagnosis not present

## 2014-09-30 DIAGNOSIS — M545 Low back pain: Secondary | ICD-10-CM | POA: Diagnosis not present

## 2014-09-30 DIAGNOSIS — M542 Cervicalgia: Secondary | ICD-10-CM | POA: Diagnosis not present

## 2014-10-03 DIAGNOSIS — M542 Cervicalgia: Secondary | ICD-10-CM | POA: Diagnosis not present

## 2014-10-03 DIAGNOSIS — M9901 Segmental and somatic dysfunction of cervical region: Secondary | ICD-10-CM | POA: Diagnosis not present

## 2014-10-03 DIAGNOSIS — M9903 Segmental and somatic dysfunction of lumbar region: Secondary | ICD-10-CM | POA: Diagnosis not present

## 2014-10-03 DIAGNOSIS — M545 Low back pain: Secondary | ICD-10-CM | POA: Diagnosis not present

## 2014-10-07 DIAGNOSIS — M545 Low back pain: Secondary | ICD-10-CM | POA: Diagnosis not present

## 2014-10-07 DIAGNOSIS — M9903 Segmental and somatic dysfunction of lumbar region: Secondary | ICD-10-CM | POA: Diagnosis not present

## 2014-10-07 DIAGNOSIS — M542 Cervicalgia: Secondary | ICD-10-CM | POA: Diagnosis not present

## 2014-10-07 DIAGNOSIS — M9901 Segmental and somatic dysfunction of cervical region: Secondary | ICD-10-CM | POA: Diagnosis not present

## 2014-10-09 DIAGNOSIS — H4011X1 Primary open-angle glaucoma, mild stage: Secondary | ICD-10-CM | POA: Diagnosis not present

## 2014-10-09 DIAGNOSIS — H2513 Age-related nuclear cataract, bilateral: Secondary | ICD-10-CM | POA: Diagnosis not present

## 2014-10-16 DIAGNOSIS — M9903 Segmental and somatic dysfunction of lumbar region: Secondary | ICD-10-CM | POA: Diagnosis not present

## 2014-10-16 DIAGNOSIS — M545 Low back pain: Secondary | ICD-10-CM | POA: Diagnosis not present

## 2014-10-16 DIAGNOSIS — M542 Cervicalgia: Secondary | ICD-10-CM | POA: Diagnosis not present

## 2014-10-16 DIAGNOSIS — M9901 Segmental and somatic dysfunction of cervical region: Secondary | ICD-10-CM | POA: Diagnosis not present

## 2014-10-30 DIAGNOSIS — M545 Low back pain: Secondary | ICD-10-CM | POA: Diagnosis not present

## 2014-10-30 DIAGNOSIS — M542 Cervicalgia: Secondary | ICD-10-CM | POA: Diagnosis not present

## 2014-10-30 DIAGNOSIS — M9903 Segmental and somatic dysfunction of lumbar region: Secondary | ICD-10-CM | POA: Diagnosis not present

## 2014-10-30 DIAGNOSIS — M9901 Segmental and somatic dysfunction of cervical region: Secondary | ICD-10-CM | POA: Diagnosis not present

## 2014-11-27 DIAGNOSIS — M9901 Segmental and somatic dysfunction of cervical region: Secondary | ICD-10-CM | POA: Diagnosis not present

## 2014-11-27 DIAGNOSIS — M545 Low back pain: Secondary | ICD-10-CM | POA: Diagnosis not present

## 2014-11-27 DIAGNOSIS — M542 Cervicalgia: Secondary | ICD-10-CM | POA: Diagnosis not present

## 2014-11-27 DIAGNOSIS — M9903 Segmental and somatic dysfunction of lumbar region: Secondary | ICD-10-CM | POA: Diagnosis not present

## 2014-12-05 DIAGNOSIS — B356 Tinea cruris: Secondary | ICD-10-CM | POA: Diagnosis not present

## 2014-12-05 DIAGNOSIS — Z6833 Body mass index (BMI) 33.0-33.9, adult: Secondary | ICD-10-CM | POA: Diagnosis not present

## 2014-12-05 DIAGNOSIS — B353 Tinea pedis: Secondary | ICD-10-CM | POA: Diagnosis not present

## 2015-01-09 DIAGNOSIS — L570 Actinic keratosis: Secondary | ICD-10-CM | POA: Diagnosis not present

## 2015-01-09 DIAGNOSIS — D485 Neoplasm of uncertain behavior of skin: Secondary | ICD-10-CM | POA: Diagnosis not present

## 2015-01-09 DIAGNOSIS — L738 Other specified follicular disorders: Secondary | ICD-10-CM | POA: Diagnosis not present

## 2015-01-09 DIAGNOSIS — L821 Other seborrheic keratosis: Secondary | ICD-10-CM | POA: Diagnosis not present

## 2015-01-09 DIAGNOSIS — D225 Melanocytic nevi of trunk: Secondary | ICD-10-CM | POA: Diagnosis not present

## 2015-01-09 DIAGNOSIS — Z85828 Personal history of other malignant neoplasm of skin: Secondary | ICD-10-CM | POA: Diagnosis not present

## 2015-01-09 DIAGNOSIS — L814 Other melanin hyperpigmentation: Secondary | ICD-10-CM | POA: Diagnosis not present

## 2015-01-09 DIAGNOSIS — D2271 Melanocytic nevi of right lower limb, including hip: Secondary | ICD-10-CM | POA: Diagnosis not present

## 2015-01-09 DIAGNOSIS — L723 Sebaceous cyst: Secondary | ICD-10-CM | POA: Diagnosis not present

## 2015-01-09 DIAGNOSIS — L218 Other seborrheic dermatitis: Secondary | ICD-10-CM | POA: Diagnosis not present

## 2015-01-28 DIAGNOSIS — R351 Nocturia: Secondary | ICD-10-CM | POA: Diagnosis not present

## 2015-01-28 DIAGNOSIS — R3915 Urgency of urination: Secondary | ICD-10-CM | POA: Diagnosis not present

## 2015-01-28 DIAGNOSIS — R350 Frequency of micturition: Secondary | ICD-10-CM | POA: Diagnosis not present

## 2015-01-28 DIAGNOSIS — N401 Enlarged prostate with lower urinary tract symptoms: Secondary | ICD-10-CM | POA: Diagnosis not present

## 2015-01-28 DIAGNOSIS — N138 Other obstructive and reflux uropathy: Secondary | ICD-10-CM | POA: Diagnosis not present

## 2015-02-20 DIAGNOSIS — N529 Male erectile dysfunction, unspecified: Secondary | ICD-10-CM | POA: Diagnosis not present

## 2015-02-20 DIAGNOSIS — I10 Essential (primary) hypertension: Secondary | ICD-10-CM | POA: Diagnosis not present

## 2015-02-20 DIAGNOSIS — M109 Gout, unspecified: Secondary | ICD-10-CM | POA: Diagnosis not present

## 2015-02-20 DIAGNOSIS — Z125 Encounter for screening for malignant neoplasm of prostate: Secondary | ICD-10-CM | POA: Diagnosis not present

## 2015-02-20 DIAGNOSIS — E785 Hyperlipidemia, unspecified: Secondary | ICD-10-CM | POA: Diagnosis not present

## 2015-02-23 DIAGNOSIS — H5203 Hypermetropia, bilateral: Secondary | ICD-10-CM | POA: Diagnosis not present

## 2015-02-23 DIAGNOSIS — H524 Presbyopia: Secondary | ICD-10-CM | POA: Diagnosis not present

## 2015-02-23 DIAGNOSIS — H4011X1 Primary open-angle glaucoma, mild stage: Secondary | ICD-10-CM | POA: Diagnosis not present

## 2015-02-27 DIAGNOSIS — E785 Hyperlipidemia, unspecified: Secondary | ICD-10-CM | POA: Diagnosis not present

## 2015-02-27 DIAGNOSIS — I10 Essential (primary) hypertension: Secondary | ICD-10-CM | POA: Diagnosis not present

## 2015-02-27 DIAGNOSIS — I4510 Unspecified right bundle-branch block: Secondary | ICD-10-CM | POA: Diagnosis not present

## 2015-02-27 DIAGNOSIS — M1990 Unspecified osteoarthritis, unspecified site: Secondary | ICD-10-CM | POA: Diagnosis not present

## 2015-02-27 DIAGNOSIS — H409 Unspecified glaucoma: Secondary | ICD-10-CM | POA: Diagnosis not present

## 2015-02-27 DIAGNOSIS — Z6833 Body mass index (BMI) 33.0-33.9, adult: Secondary | ICD-10-CM | POA: Diagnosis not present

## 2015-02-27 DIAGNOSIS — K219 Gastro-esophageal reflux disease without esophagitis: Secondary | ICD-10-CM | POA: Diagnosis not present

## 2015-02-27 DIAGNOSIS — N182 Chronic kidney disease, stage 2 (mild): Secondary | ICD-10-CM | POA: Diagnosis not present

## 2015-02-27 DIAGNOSIS — M109 Gout, unspecified: Secondary | ICD-10-CM | POA: Diagnosis not present

## 2015-02-27 DIAGNOSIS — F438 Other reactions to severe stress: Secondary | ICD-10-CM | POA: Diagnosis not present

## 2015-02-27 DIAGNOSIS — Z0000 Encounter for general adult medical examination without abnormal findings: Secondary | ICD-10-CM | POA: Diagnosis not present

## 2015-02-27 DIAGNOSIS — Z1389 Encounter for screening for other disorder: Secondary | ICD-10-CM | POA: Diagnosis not present

## 2015-03-02 DIAGNOSIS — Z85828 Personal history of other malignant neoplasm of skin: Secondary | ICD-10-CM | POA: Diagnosis not present

## 2015-03-02 DIAGNOSIS — D485 Neoplasm of uncertain behavior of skin: Secondary | ICD-10-CM | POA: Diagnosis not present

## 2015-03-02 DIAGNOSIS — L089 Local infection of the skin and subcutaneous tissue, unspecified: Secondary | ICD-10-CM | POA: Diagnosis not present

## 2015-03-11 DIAGNOSIS — M7062 Trochanteric bursitis, left hip: Secondary | ICD-10-CM | POA: Diagnosis not present

## 2015-04-18 DIAGNOSIS — A09 Infectious gastroenteritis and colitis, unspecified: Secondary | ICD-10-CM | POA: Diagnosis not present

## 2015-04-24 DIAGNOSIS — I10 Essential (primary) hypertension: Secondary | ICD-10-CM | POA: Diagnosis not present

## 2015-04-24 DIAGNOSIS — Z6831 Body mass index (BMI) 31.0-31.9, adult: Secondary | ICD-10-CM | POA: Diagnosis not present

## 2015-04-24 DIAGNOSIS — K219 Gastro-esophageal reflux disease without esophagitis: Secondary | ICD-10-CM | POA: Diagnosis not present

## 2015-04-24 DIAGNOSIS — A09 Infectious gastroenteritis and colitis, unspecified: Secondary | ICD-10-CM | POA: Diagnosis not present

## 2015-06-25 DIAGNOSIS — H524 Presbyopia: Secondary | ICD-10-CM | POA: Diagnosis not present

## 2015-06-25 DIAGNOSIS — H25813 Combined forms of age-related cataract, bilateral: Secondary | ICD-10-CM | POA: Diagnosis not present

## 2015-06-25 DIAGNOSIS — H401131 Primary open-angle glaucoma, bilateral, mild stage: Secondary | ICD-10-CM | POA: Diagnosis not present

## 2015-09-08 DIAGNOSIS — M109 Gout, unspecified: Secondary | ICD-10-CM | POA: Diagnosis not present

## 2015-09-08 DIAGNOSIS — R197 Diarrhea, unspecified: Secondary | ICD-10-CM | POA: Diagnosis not present

## 2015-09-08 DIAGNOSIS — Z6833 Body mass index (BMI) 33.0-33.9, adult: Secondary | ICD-10-CM | POA: Diagnosis not present

## 2015-09-08 DIAGNOSIS — I10 Essential (primary) hypertension: Secondary | ICD-10-CM | POA: Diagnosis not present

## 2015-09-08 DIAGNOSIS — E668 Other obesity: Secondary | ICD-10-CM | POA: Diagnosis not present

## 2015-09-08 DIAGNOSIS — E784 Other hyperlipidemia: Secondary | ICD-10-CM | POA: Diagnosis not present

## 2015-12-24 DIAGNOSIS — H25813 Combined forms of age-related cataract, bilateral: Secondary | ICD-10-CM | POA: Diagnosis not present

## 2015-12-24 DIAGNOSIS — H401131 Primary open-angle glaucoma, bilateral, mild stage: Secondary | ICD-10-CM | POA: Diagnosis not present

## 2016-01-07 DIAGNOSIS — H401131 Primary open-angle glaucoma, bilateral, mild stage: Secondary | ICD-10-CM | POA: Diagnosis not present

## 2016-01-07 DIAGNOSIS — H524 Presbyopia: Secondary | ICD-10-CM | POA: Diagnosis not present

## 2016-01-07 DIAGNOSIS — H25813 Combined forms of age-related cataract, bilateral: Secondary | ICD-10-CM | POA: Diagnosis not present

## 2016-01-13 DIAGNOSIS — M9903 Segmental and somatic dysfunction of lumbar region: Secondary | ICD-10-CM | POA: Diagnosis not present

## 2016-01-13 DIAGNOSIS — M9901 Segmental and somatic dysfunction of cervical region: Secondary | ICD-10-CM | POA: Diagnosis not present

## 2016-01-13 DIAGNOSIS — M9902 Segmental and somatic dysfunction of thoracic region: Secondary | ICD-10-CM | POA: Diagnosis not present

## 2016-01-13 DIAGNOSIS — M4726 Other spondylosis with radiculopathy, lumbar region: Secondary | ICD-10-CM | POA: Diagnosis not present

## 2016-01-14 DIAGNOSIS — M9903 Segmental and somatic dysfunction of lumbar region: Secondary | ICD-10-CM | POA: Diagnosis not present

## 2016-01-14 DIAGNOSIS — M9901 Segmental and somatic dysfunction of cervical region: Secondary | ICD-10-CM | POA: Diagnosis not present

## 2016-01-14 DIAGNOSIS — M9902 Segmental and somatic dysfunction of thoracic region: Secondary | ICD-10-CM | POA: Diagnosis not present

## 2016-01-14 DIAGNOSIS — M4726 Other spondylosis with radiculopathy, lumbar region: Secondary | ICD-10-CM | POA: Diagnosis not present

## 2016-01-18 DIAGNOSIS — M4726 Other spondylosis with radiculopathy, lumbar region: Secondary | ICD-10-CM | POA: Diagnosis not present

## 2016-01-18 DIAGNOSIS — M9902 Segmental and somatic dysfunction of thoracic region: Secondary | ICD-10-CM | POA: Diagnosis not present

## 2016-01-18 DIAGNOSIS — M9901 Segmental and somatic dysfunction of cervical region: Secondary | ICD-10-CM | POA: Diagnosis not present

## 2016-01-18 DIAGNOSIS — M9903 Segmental and somatic dysfunction of lumbar region: Secondary | ICD-10-CM | POA: Diagnosis not present

## 2016-01-19 DIAGNOSIS — M9902 Segmental and somatic dysfunction of thoracic region: Secondary | ICD-10-CM | POA: Diagnosis not present

## 2016-01-19 DIAGNOSIS — M4726 Other spondylosis with radiculopathy, lumbar region: Secondary | ICD-10-CM | POA: Diagnosis not present

## 2016-01-19 DIAGNOSIS — M9903 Segmental and somatic dysfunction of lumbar region: Secondary | ICD-10-CM | POA: Diagnosis not present

## 2016-01-19 DIAGNOSIS — M9901 Segmental and somatic dysfunction of cervical region: Secondary | ICD-10-CM | POA: Diagnosis not present

## 2016-01-20 DIAGNOSIS — M9901 Segmental and somatic dysfunction of cervical region: Secondary | ICD-10-CM | POA: Diagnosis not present

## 2016-01-20 DIAGNOSIS — M4726 Other spondylosis with radiculopathy, lumbar region: Secondary | ICD-10-CM | POA: Diagnosis not present

## 2016-01-20 DIAGNOSIS — M9902 Segmental and somatic dysfunction of thoracic region: Secondary | ICD-10-CM | POA: Diagnosis not present

## 2016-01-20 DIAGNOSIS — M9903 Segmental and somatic dysfunction of lumbar region: Secondary | ICD-10-CM | POA: Diagnosis not present

## 2016-01-25 DIAGNOSIS — M9903 Segmental and somatic dysfunction of lumbar region: Secondary | ICD-10-CM | POA: Diagnosis not present

## 2016-01-25 DIAGNOSIS — M9901 Segmental and somatic dysfunction of cervical region: Secondary | ICD-10-CM | POA: Diagnosis not present

## 2016-01-25 DIAGNOSIS — M9902 Segmental and somatic dysfunction of thoracic region: Secondary | ICD-10-CM | POA: Diagnosis not present

## 2016-01-25 DIAGNOSIS — M4726 Other spondylosis with radiculopathy, lumbar region: Secondary | ICD-10-CM | POA: Diagnosis not present

## 2016-01-26 DIAGNOSIS — M9903 Segmental and somatic dysfunction of lumbar region: Secondary | ICD-10-CM | POA: Diagnosis not present

## 2016-01-26 DIAGNOSIS — M9901 Segmental and somatic dysfunction of cervical region: Secondary | ICD-10-CM | POA: Diagnosis not present

## 2016-01-26 DIAGNOSIS — M4726 Other spondylosis with radiculopathy, lumbar region: Secondary | ICD-10-CM | POA: Diagnosis not present

## 2016-01-26 DIAGNOSIS — M9902 Segmental and somatic dysfunction of thoracic region: Secondary | ICD-10-CM | POA: Diagnosis not present

## 2016-01-27 DIAGNOSIS — M9903 Segmental and somatic dysfunction of lumbar region: Secondary | ICD-10-CM | POA: Diagnosis not present

## 2016-01-27 DIAGNOSIS — M4726 Other spondylosis with radiculopathy, lumbar region: Secondary | ICD-10-CM | POA: Diagnosis not present

## 2016-01-27 DIAGNOSIS — M9901 Segmental and somatic dysfunction of cervical region: Secondary | ICD-10-CM | POA: Diagnosis not present

## 2016-01-27 DIAGNOSIS — M9902 Segmental and somatic dysfunction of thoracic region: Secondary | ICD-10-CM | POA: Diagnosis not present

## 2016-02-01 DIAGNOSIS — M9903 Segmental and somatic dysfunction of lumbar region: Secondary | ICD-10-CM | POA: Diagnosis not present

## 2016-02-01 DIAGNOSIS — M4726 Other spondylosis with radiculopathy, lumbar region: Secondary | ICD-10-CM | POA: Diagnosis not present

## 2016-02-01 DIAGNOSIS — M9902 Segmental and somatic dysfunction of thoracic region: Secondary | ICD-10-CM | POA: Diagnosis not present

## 2016-02-01 DIAGNOSIS — M9901 Segmental and somatic dysfunction of cervical region: Secondary | ICD-10-CM | POA: Diagnosis not present

## 2016-02-02 DIAGNOSIS — M4726 Other spondylosis with radiculopathy, lumbar region: Secondary | ICD-10-CM | POA: Diagnosis not present

## 2016-02-02 DIAGNOSIS — M9903 Segmental and somatic dysfunction of lumbar region: Secondary | ICD-10-CM | POA: Diagnosis not present

## 2016-02-02 DIAGNOSIS — M9901 Segmental and somatic dysfunction of cervical region: Secondary | ICD-10-CM | POA: Diagnosis not present

## 2016-02-02 DIAGNOSIS — M9902 Segmental and somatic dysfunction of thoracic region: Secondary | ICD-10-CM | POA: Diagnosis not present

## 2016-02-03 DIAGNOSIS — M4726 Other spondylosis with radiculopathy, lumbar region: Secondary | ICD-10-CM | POA: Diagnosis not present

## 2016-02-03 DIAGNOSIS — M9901 Segmental and somatic dysfunction of cervical region: Secondary | ICD-10-CM | POA: Diagnosis not present

## 2016-02-03 DIAGNOSIS — M9903 Segmental and somatic dysfunction of lumbar region: Secondary | ICD-10-CM | POA: Diagnosis not present

## 2016-02-03 DIAGNOSIS — M9902 Segmental and somatic dysfunction of thoracic region: Secondary | ICD-10-CM | POA: Diagnosis not present

## 2016-02-08 DIAGNOSIS — M9903 Segmental and somatic dysfunction of lumbar region: Secondary | ICD-10-CM | POA: Diagnosis not present

## 2016-02-08 DIAGNOSIS — M4726 Other spondylosis with radiculopathy, lumbar region: Secondary | ICD-10-CM | POA: Diagnosis not present

## 2016-02-08 DIAGNOSIS — M9902 Segmental and somatic dysfunction of thoracic region: Secondary | ICD-10-CM | POA: Diagnosis not present

## 2016-02-08 DIAGNOSIS — M9901 Segmental and somatic dysfunction of cervical region: Secondary | ICD-10-CM | POA: Diagnosis not present

## 2016-02-10 DIAGNOSIS — M9901 Segmental and somatic dysfunction of cervical region: Secondary | ICD-10-CM | POA: Diagnosis not present

## 2016-02-10 DIAGNOSIS — M4726 Other spondylosis with radiculopathy, lumbar region: Secondary | ICD-10-CM | POA: Diagnosis not present

## 2016-02-10 DIAGNOSIS — M9902 Segmental and somatic dysfunction of thoracic region: Secondary | ICD-10-CM | POA: Diagnosis not present

## 2016-02-10 DIAGNOSIS — M9903 Segmental and somatic dysfunction of lumbar region: Secondary | ICD-10-CM | POA: Diagnosis not present

## 2016-03-07 DIAGNOSIS — L218 Other seborrheic dermatitis: Secondary | ICD-10-CM | POA: Diagnosis not present

## 2016-03-07 DIAGNOSIS — L570 Actinic keratosis: Secondary | ICD-10-CM | POA: Diagnosis not present

## 2016-03-07 DIAGNOSIS — L821 Other seborrheic keratosis: Secondary | ICD-10-CM | POA: Diagnosis not present

## 2016-03-07 DIAGNOSIS — E784 Other hyperlipidemia: Secondary | ICD-10-CM | POA: Diagnosis not present

## 2016-03-07 DIAGNOSIS — L720 Epidermal cyst: Secondary | ICD-10-CM | POA: Diagnosis not present

## 2016-03-07 DIAGNOSIS — D1801 Hemangioma of skin and subcutaneous tissue: Secondary | ICD-10-CM | POA: Diagnosis not present

## 2016-03-07 DIAGNOSIS — I10 Essential (primary) hypertension: Secondary | ICD-10-CM | POA: Diagnosis not present

## 2016-03-07 DIAGNOSIS — Z85828 Personal history of other malignant neoplasm of skin: Secondary | ICD-10-CM | POA: Diagnosis not present

## 2016-03-07 DIAGNOSIS — D225 Melanocytic nevi of trunk: Secondary | ICD-10-CM | POA: Diagnosis not present

## 2016-03-07 DIAGNOSIS — Z125 Encounter for screening for malignant neoplasm of prostate: Secondary | ICD-10-CM | POA: Diagnosis not present

## 2016-03-07 DIAGNOSIS — D2261 Melanocytic nevi of right upper limb, including shoulder: Secondary | ICD-10-CM | POA: Diagnosis not present

## 2016-03-07 DIAGNOSIS — M109 Gout, unspecified: Secondary | ICD-10-CM | POA: Diagnosis not present

## 2016-03-14 ENCOUNTER — Other Ambulatory Visit: Payer: Self-pay | Admitting: Internal Medicine

## 2016-03-14 DIAGNOSIS — M1990 Unspecified osteoarthritis, unspecified site: Secondary | ICD-10-CM | POA: Diagnosis not present

## 2016-03-14 DIAGNOSIS — E669 Obesity, unspecified: Secondary | ICD-10-CM | POA: Diagnosis not present

## 2016-03-14 DIAGNOSIS — Z1389 Encounter for screening for other disorder: Secondary | ICD-10-CM | POA: Diagnosis not present

## 2016-03-14 DIAGNOSIS — R197 Diarrhea, unspecified: Secondary | ICD-10-CM | POA: Diagnosis not present

## 2016-03-14 DIAGNOSIS — Z0000 Encounter for general adult medical examination without abnormal findings: Secondary | ICD-10-CM | POA: Diagnosis not present

## 2016-03-14 DIAGNOSIS — I10 Essential (primary) hypertension: Secondary | ICD-10-CM | POA: Diagnosis not present

## 2016-03-14 DIAGNOSIS — E784 Other hyperlipidemia: Secondary | ICD-10-CM | POA: Diagnosis not present

## 2016-03-14 DIAGNOSIS — N182 Chronic kidney disease, stage 2 (mild): Secondary | ICD-10-CM | POA: Diagnosis not present

## 2016-03-14 DIAGNOSIS — K219 Gastro-esophageal reflux disease without esophagitis: Secondary | ICD-10-CM | POA: Diagnosis not present

## 2016-03-14 DIAGNOSIS — M109 Gout, unspecified: Secondary | ICD-10-CM | POA: Diagnosis not present

## 2016-03-14 DIAGNOSIS — N401 Enlarged prostate with lower urinary tract symptoms: Secondary | ICD-10-CM | POA: Diagnosis not present

## 2016-03-14 DIAGNOSIS — H409 Unspecified glaucoma: Secondary | ICD-10-CM | POA: Diagnosis not present

## 2016-03-14 DIAGNOSIS — Z87891 Personal history of nicotine dependence: Secondary | ICD-10-CM

## 2016-03-14 DIAGNOSIS — I700 Atherosclerosis of aorta: Secondary | ICD-10-CM

## 2016-03-25 ENCOUNTER — Ambulatory Visit
Admission: RE | Admit: 2016-03-25 | Discharge: 2016-03-25 | Disposition: A | Discharge status: Home / Self Care | Payer: Medicare Other | Source: Ambulatory Visit | Attending: Internal Medicine | Admitting: Internal Medicine

## 2016-03-25 DIAGNOSIS — I77811 Abdominal aortic ectasia: Secondary | ICD-10-CM | POA: Diagnosis not present

## 2016-03-25 DIAGNOSIS — I700 Atherosclerosis of aorta: Secondary | ICD-10-CM | POA: Diagnosis not present

## 2016-03-25 DIAGNOSIS — Z87891 Personal history of nicotine dependence: Secondary | ICD-10-CM

## 2016-03-25 DIAGNOSIS — Z136 Encounter for screening for cardiovascular disorders: Secondary | ICD-10-CM | POA: Diagnosis not present

## 2016-03-25 DIAGNOSIS — Z720 Tobacco use: Secondary | ICD-10-CM | POA: Diagnosis not present

## 2016-05-12 DIAGNOSIS — H524 Presbyopia: Secondary | ICD-10-CM | POA: Diagnosis not present

## 2016-05-12 DIAGNOSIS — H401131 Primary open-angle glaucoma, bilateral, mild stage: Secondary | ICD-10-CM | POA: Diagnosis not present

## 2016-05-12 DIAGNOSIS — H353111 Nonexudative age-related macular degeneration, right eye, early dry stage: Secondary | ICD-10-CM | POA: Diagnosis not present

## 2016-05-12 DIAGNOSIS — H25813 Combined forms of age-related cataract, bilateral: Secondary | ICD-10-CM | POA: Diagnosis not present

## 2016-05-18 DIAGNOSIS — D225 Melanocytic nevi of trunk: Secondary | ICD-10-CM | POA: Diagnosis not present

## 2016-05-18 DIAGNOSIS — L821 Other seborrheic keratosis: Secondary | ICD-10-CM | POA: Diagnosis not present

## 2016-05-18 DIAGNOSIS — D1801 Hemangioma of skin and subcutaneous tissue: Secondary | ICD-10-CM | POA: Diagnosis not present

## 2016-05-18 DIAGNOSIS — Z85828 Personal history of other malignant neoplasm of skin: Secondary | ICD-10-CM | POA: Diagnosis not present

## 2016-05-18 DIAGNOSIS — D485 Neoplasm of uncertain behavior of skin: Secondary | ICD-10-CM | POA: Diagnosis not present

## 2016-06-13 DIAGNOSIS — H2511 Age-related nuclear cataract, right eye: Secondary | ICD-10-CM | POA: Diagnosis not present

## 2016-07-04 DIAGNOSIS — H2511 Age-related nuclear cataract, right eye: Secondary | ICD-10-CM | POA: Diagnosis not present

## 2016-07-04 DIAGNOSIS — H25811 Combined forms of age-related cataract, right eye: Secondary | ICD-10-CM | POA: Diagnosis not present

## 2016-07-11 DIAGNOSIS — H2512 Age-related nuclear cataract, left eye: Secondary | ICD-10-CM | POA: Diagnosis not present

## 2016-07-18 DIAGNOSIS — H2512 Age-related nuclear cataract, left eye: Secondary | ICD-10-CM | POA: Diagnosis not present

## 2016-07-18 DIAGNOSIS — H25812 Combined forms of age-related cataract, left eye: Secondary | ICD-10-CM | POA: Diagnosis not present

## 2016-09-14 DIAGNOSIS — N182 Chronic kidney disease, stage 2 (mild): Secondary | ICD-10-CM | POA: Diagnosis not present

## 2016-09-14 DIAGNOSIS — G5630 Lesion of radial nerve, unspecified upper limb: Secondary | ICD-10-CM | POA: Diagnosis not present

## 2016-09-14 DIAGNOSIS — M1990 Unspecified osteoarthritis, unspecified site: Secondary | ICD-10-CM | POA: Diagnosis not present

## 2016-09-14 DIAGNOSIS — I10 Essential (primary) hypertension: Secondary | ICD-10-CM | POA: Diagnosis not present

## 2016-09-14 DIAGNOSIS — E784 Other hyperlipidemia: Secondary | ICD-10-CM | POA: Diagnosis not present

## 2016-09-14 DIAGNOSIS — E669 Obesity, unspecified: Secondary | ICD-10-CM | POA: Diagnosis not present

## 2016-09-14 DIAGNOSIS — I700 Atherosclerosis of aorta: Secondary | ICD-10-CM | POA: Diagnosis not present

## 2016-10-31 DIAGNOSIS — M79642 Pain in left hand: Secondary | ICD-10-CM | POA: Diagnosis not present

## 2016-10-31 DIAGNOSIS — M79641 Pain in right hand: Secondary | ICD-10-CM | POA: Diagnosis not present

## 2016-10-31 DIAGNOSIS — G5603 Carpal tunnel syndrome, bilateral upper limbs: Secondary | ICD-10-CM | POA: Diagnosis not present

## 2016-11-17 DIAGNOSIS — M180 Bilateral primary osteoarthritis of first carpometacarpal joints: Secondary | ICD-10-CM | POA: Diagnosis not present

## 2016-11-17 DIAGNOSIS — G5603 Carpal tunnel syndrome, bilateral upper limbs: Secondary | ICD-10-CM | POA: Diagnosis not present

## 2016-12-29 DIAGNOSIS — G5601 Carpal tunnel syndrome, right upper limb: Secondary | ICD-10-CM | POA: Diagnosis not present

## 2016-12-29 DIAGNOSIS — M180 Bilateral primary osteoarthritis of first carpometacarpal joints: Secondary | ICD-10-CM | POA: Diagnosis not present

## 2016-12-29 DIAGNOSIS — G5603 Carpal tunnel syndrome, bilateral upper limbs: Secondary | ICD-10-CM | POA: Diagnosis not present

## 2017-01-05 DIAGNOSIS — H26492 Other secondary cataract, left eye: Secondary | ICD-10-CM | POA: Diagnosis not present

## 2017-01-05 DIAGNOSIS — H353111 Nonexudative age-related macular degeneration, right eye, early dry stage: Secondary | ICD-10-CM | POA: Diagnosis not present

## 2017-01-05 DIAGNOSIS — Z961 Presence of intraocular lens: Secondary | ICD-10-CM | POA: Diagnosis not present

## 2017-01-05 DIAGNOSIS — H401131 Primary open-angle glaucoma, bilateral, mild stage: Secondary | ICD-10-CM | POA: Diagnosis not present

## 2017-01-05 DIAGNOSIS — H524 Presbyopia: Secondary | ICD-10-CM | POA: Diagnosis not present

## 2017-02-02 DIAGNOSIS — H35411 Lattice degeneration of retina, right eye: Secondary | ICD-10-CM | POA: Diagnosis not present

## 2017-02-02 DIAGNOSIS — Q141 Congenital malformation of retina: Secondary | ICD-10-CM | POA: Diagnosis not present

## 2017-02-02 DIAGNOSIS — H353132 Nonexudative age-related macular degeneration, bilateral, intermediate dry stage: Secondary | ICD-10-CM | POA: Diagnosis not present

## 2017-02-02 DIAGNOSIS — H43813 Vitreous degeneration, bilateral: Secondary | ICD-10-CM | POA: Diagnosis not present

## 2017-02-21 DIAGNOSIS — M4726 Other spondylosis with radiculopathy, lumbar region: Secondary | ICD-10-CM | POA: Diagnosis not present

## 2017-02-21 DIAGNOSIS — M9903 Segmental and somatic dysfunction of lumbar region: Secondary | ICD-10-CM | POA: Diagnosis not present

## 2017-02-21 DIAGNOSIS — M9902 Segmental and somatic dysfunction of thoracic region: Secondary | ICD-10-CM | POA: Diagnosis not present

## 2017-02-21 DIAGNOSIS — M9901 Segmental and somatic dysfunction of cervical region: Secondary | ICD-10-CM | POA: Diagnosis not present

## 2017-02-22 DIAGNOSIS — M9903 Segmental and somatic dysfunction of lumbar region: Secondary | ICD-10-CM | POA: Diagnosis not present

## 2017-02-22 DIAGNOSIS — M9902 Segmental and somatic dysfunction of thoracic region: Secondary | ICD-10-CM | POA: Diagnosis not present

## 2017-02-22 DIAGNOSIS — M9901 Segmental and somatic dysfunction of cervical region: Secondary | ICD-10-CM | POA: Diagnosis not present

## 2017-02-22 DIAGNOSIS — M4726 Other spondylosis with radiculopathy, lumbar region: Secondary | ICD-10-CM | POA: Diagnosis not present

## 2017-02-22 DIAGNOSIS — G5601 Carpal tunnel syndrome, right upper limb: Secondary | ICD-10-CM | POA: Diagnosis not present

## 2017-02-23 DIAGNOSIS — M4726 Other spondylosis with radiculopathy, lumbar region: Secondary | ICD-10-CM | POA: Diagnosis not present

## 2017-02-23 DIAGNOSIS — M9903 Segmental and somatic dysfunction of lumbar region: Secondary | ICD-10-CM | POA: Diagnosis not present

## 2017-02-23 DIAGNOSIS — M9902 Segmental and somatic dysfunction of thoracic region: Secondary | ICD-10-CM | POA: Diagnosis not present

## 2017-02-23 DIAGNOSIS — M9901 Segmental and somatic dysfunction of cervical region: Secondary | ICD-10-CM | POA: Diagnosis not present

## 2017-02-27 DIAGNOSIS — M9901 Segmental and somatic dysfunction of cervical region: Secondary | ICD-10-CM | POA: Diagnosis not present

## 2017-02-27 DIAGNOSIS — M9902 Segmental and somatic dysfunction of thoracic region: Secondary | ICD-10-CM | POA: Diagnosis not present

## 2017-02-27 DIAGNOSIS — M4726 Other spondylosis with radiculopathy, lumbar region: Secondary | ICD-10-CM | POA: Diagnosis not present

## 2017-02-27 DIAGNOSIS — M9903 Segmental and somatic dysfunction of lumbar region: Secondary | ICD-10-CM | POA: Diagnosis not present

## 2017-03-07 DIAGNOSIS — N401 Enlarged prostate with lower urinary tract symptoms: Secondary | ICD-10-CM | POA: Diagnosis not present

## 2017-03-07 DIAGNOSIS — R351 Nocturia: Secondary | ICD-10-CM | POA: Diagnosis not present

## 2017-03-07 DIAGNOSIS — N5201 Erectile dysfunction due to arterial insufficiency: Secondary | ICD-10-CM | POA: Diagnosis not present

## 2017-03-16 DIAGNOSIS — H35411 Lattice degeneration of retina, right eye: Secondary | ICD-10-CM | POA: Diagnosis not present

## 2017-03-16 DIAGNOSIS — Q141 Congenital malformation of retina: Secondary | ICD-10-CM | POA: Diagnosis not present

## 2017-03-16 DIAGNOSIS — H43813 Vitreous degeneration, bilateral: Secondary | ICD-10-CM | POA: Diagnosis not present

## 2017-03-16 DIAGNOSIS — H353132 Nonexudative age-related macular degeneration, bilateral, intermediate dry stage: Secondary | ICD-10-CM | POA: Diagnosis not present

## 2017-04-04 DIAGNOSIS — M9902 Segmental and somatic dysfunction of thoracic region: Secondary | ICD-10-CM | POA: Diagnosis not present

## 2017-04-04 DIAGNOSIS — M9903 Segmental and somatic dysfunction of lumbar region: Secondary | ICD-10-CM | POA: Diagnosis not present

## 2017-04-04 DIAGNOSIS — M9901 Segmental and somatic dysfunction of cervical region: Secondary | ICD-10-CM | POA: Diagnosis not present

## 2017-04-04 DIAGNOSIS — M4726 Other spondylosis with radiculopathy, lumbar region: Secondary | ICD-10-CM | POA: Diagnosis not present

## 2017-04-07 DIAGNOSIS — Z125 Encounter for screening for malignant neoplasm of prostate: Secondary | ICD-10-CM | POA: Diagnosis not present

## 2017-04-07 DIAGNOSIS — I10 Essential (primary) hypertension: Secondary | ICD-10-CM | POA: Diagnosis not present

## 2017-04-07 DIAGNOSIS — E784 Other hyperlipidemia: Secondary | ICD-10-CM | POA: Diagnosis not present

## 2017-04-07 DIAGNOSIS — M109 Gout, unspecified: Secondary | ICD-10-CM | POA: Diagnosis not present

## 2017-04-13 DIAGNOSIS — R350 Frequency of micturition: Secondary | ICD-10-CM | POA: Diagnosis not present

## 2017-04-13 DIAGNOSIS — R3915 Urgency of urination: Secondary | ICD-10-CM | POA: Diagnosis not present

## 2017-04-14 DIAGNOSIS — E784 Other hyperlipidemia: Secondary | ICD-10-CM | POA: Diagnosis not present

## 2017-04-14 DIAGNOSIS — Z1389 Encounter for screening for other disorder: Secondary | ICD-10-CM | POA: Diagnosis not present

## 2017-04-14 DIAGNOSIS — E668 Other obesity: Secondary | ICD-10-CM | POA: Diagnosis not present

## 2017-04-14 DIAGNOSIS — Z6834 Body mass index (BMI) 34.0-34.9, adult: Secondary | ICD-10-CM | POA: Diagnosis not present

## 2017-04-14 DIAGNOSIS — M1990 Unspecified osteoarthritis, unspecified site: Secondary | ICD-10-CM | POA: Diagnosis not present

## 2017-04-14 DIAGNOSIS — M109 Gout, unspecified: Secondary | ICD-10-CM | POA: Diagnosis not present

## 2017-04-14 DIAGNOSIS — K219 Gastro-esophageal reflux disease without esophagitis: Secondary | ICD-10-CM | POA: Diagnosis not present

## 2017-04-14 DIAGNOSIS — Z0000 Encounter for general adult medical examination without abnormal findings: Secondary | ICD-10-CM | POA: Diagnosis not present

## 2017-04-14 DIAGNOSIS — I10 Essential (primary) hypertension: Secondary | ICD-10-CM | POA: Diagnosis not present

## 2017-04-14 DIAGNOSIS — I700 Atherosclerosis of aorta: Secondary | ICD-10-CM | POA: Diagnosis not present

## 2017-04-14 DIAGNOSIS — N401 Enlarged prostate with lower urinary tract symptoms: Secondary | ICD-10-CM | POA: Diagnosis not present

## 2017-04-14 DIAGNOSIS — I4510 Unspecified right bundle-branch block: Secondary | ICD-10-CM | POA: Diagnosis not present

## 2017-04-20 DIAGNOSIS — R350 Frequency of micturition: Secondary | ICD-10-CM | POA: Diagnosis not present

## 2017-04-25 DIAGNOSIS — G5601 Carpal tunnel syndrome, right upper limb: Secondary | ICD-10-CM | POA: Diagnosis not present

## 2017-04-28 DIAGNOSIS — R350 Frequency of micturition: Secondary | ICD-10-CM | POA: Diagnosis not present

## 2017-05-02 DIAGNOSIS — G5601 Carpal tunnel syndrome, right upper limb: Secondary | ICD-10-CM | POA: Diagnosis not present

## 2017-05-04 DIAGNOSIS — R350 Frequency of micturition: Secondary | ICD-10-CM | POA: Diagnosis not present

## 2017-05-09 DIAGNOSIS — M79642 Pain in left hand: Secondary | ICD-10-CM | POA: Diagnosis not present

## 2017-05-11 DIAGNOSIS — R3915 Urgency of urination: Secondary | ICD-10-CM | POA: Diagnosis not present

## 2017-05-18 DIAGNOSIS — R3915 Urgency of urination: Secondary | ICD-10-CM | POA: Diagnosis not present

## 2017-05-18 DIAGNOSIS — R350 Frequency of micturition: Secondary | ICD-10-CM | POA: Diagnosis not present

## 2017-05-24 DIAGNOSIS — G5601 Carpal tunnel syndrome, right upper limb: Secondary | ICD-10-CM | POA: Diagnosis not present

## 2017-05-24 DIAGNOSIS — D2261 Melanocytic nevi of right upper limb, including shoulder: Secondary | ICD-10-CM | POA: Diagnosis not present

## 2017-05-24 DIAGNOSIS — Z85828 Personal history of other malignant neoplasm of skin: Secondary | ICD-10-CM | POA: Diagnosis not present

## 2017-05-24 DIAGNOSIS — D1801 Hemangioma of skin and subcutaneous tissue: Secondary | ICD-10-CM | POA: Diagnosis not present

## 2017-05-24 DIAGNOSIS — D2271 Melanocytic nevi of right lower limb, including hip: Secondary | ICD-10-CM | POA: Diagnosis not present

## 2017-05-24 DIAGNOSIS — L720 Epidermal cyst: Secondary | ICD-10-CM | POA: Diagnosis not present

## 2017-05-24 DIAGNOSIS — L570 Actinic keratosis: Secondary | ICD-10-CM | POA: Diagnosis not present

## 2017-05-24 DIAGNOSIS — D225 Melanocytic nevi of trunk: Secondary | ICD-10-CM | POA: Diagnosis not present

## 2017-05-24 DIAGNOSIS — D692 Other nonthrombocytopenic purpura: Secondary | ICD-10-CM | POA: Diagnosis not present

## 2017-05-24 DIAGNOSIS — L814 Other melanin hyperpigmentation: Secondary | ICD-10-CM | POA: Diagnosis not present

## 2017-05-24 DIAGNOSIS — L821 Other seborrheic keratosis: Secondary | ICD-10-CM | POA: Diagnosis not present

## 2017-05-25 DIAGNOSIS — R350 Frequency of micturition: Secondary | ICD-10-CM | POA: Diagnosis not present

## 2017-05-25 DIAGNOSIS — R3915 Urgency of urination: Secondary | ICD-10-CM | POA: Diagnosis not present

## 2017-05-26 DIAGNOSIS — M79641 Pain in right hand: Secondary | ICD-10-CM | POA: Diagnosis not present

## 2017-05-30 DIAGNOSIS — M79642 Pain in left hand: Secondary | ICD-10-CM | POA: Diagnosis not present

## 2017-06-01 DIAGNOSIS — R350 Frequency of micturition: Secondary | ICD-10-CM | POA: Diagnosis not present

## 2017-06-01 DIAGNOSIS — R3915 Urgency of urination: Secondary | ICD-10-CM | POA: Diagnosis not present

## 2017-06-08 DIAGNOSIS — R3915 Urgency of urination: Secondary | ICD-10-CM | POA: Diagnosis not present

## 2017-06-08 DIAGNOSIS — R350 Frequency of micturition: Secondary | ICD-10-CM | POA: Diagnosis not present

## 2017-06-09 DIAGNOSIS — M79642 Pain in left hand: Secondary | ICD-10-CM | POA: Diagnosis not present

## 2017-06-14 DIAGNOSIS — M79642 Pain in left hand: Secondary | ICD-10-CM | POA: Diagnosis not present

## 2017-06-15 DIAGNOSIS — R350 Frequency of micturition: Secondary | ICD-10-CM | POA: Diagnosis not present

## 2017-06-15 DIAGNOSIS — R3915 Urgency of urination: Secondary | ICD-10-CM | POA: Diagnosis not present

## 2017-06-20 DIAGNOSIS — Z1212 Encounter for screening for malignant neoplasm of rectum: Secondary | ICD-10-CM | POA: Diagnosis not present

## 2017-06-20 DIAGNOSIS — Z1211 Encounter for screening for malignant neoplasm of colon: Secondary | ICD-10-CM | POA: Diagnosis not present

## 2017-06-22 DIAGNOSIS — R3915 Urgency of urination: Secondary | ICD-10-CM | POA: Diagnosis not present

## 2017-06-22 DIAGNOSIS — R350 Frequency of micturition: Secondary | ICD-10-CM | POA: Diagnosis not present

## 2017-06-23 DIAGNOSIS — M79642 Pain in left hand: Secondary | ICD-10-CM | POA: Diagnosis not present

## 2017-06-29 DIAGNOSIS — R3915 Urgency of urination: Secondary | ICD-10-CM | POA: Diagnosis not present

## 2017-06-29 DIAGNOSIS — R350 Frequency of micturition: Secondary | ICD-10-CM | POA: Diagnosis not present

## 2017-07-20 DIAGNOSIS — R3915 Urgency of urination: Secondary | ICD-10-CM | POA: Diagnosis not present

## 2017-07-20 DIAGNOSIS — R350 Frequency of micturition: Secondary | ICD-10-CM | POA: Diagnosis not present

## 2017-08-03 DIAGNOSIS — H43813 Vitreous degeneration, bilateral: Secondary | ICD-10-CM | POA: Diagnosis not present

## 2017-08-03 DIAGNOSIS — H26492 Other secondary cataract, left eye: Secondary | ICD-10-CM | POA: Diagnosis not present

## 2017-08-03 DIAGNOSIS — H401131 Primary open-angle glaucoma, bilateral, mild stage: Secondary | ICD-10-CM | POA: Diagnosis not present

## 2017-08-03 DIAGNOSIS — H353111 Nonexudative age-related macular degeneration, right eye, early dry stage: Secondary | ICD-10-CM | POA: Diagnosis not present

## 2017-08-24 DIAGNOSIS — R350 Frequency of micturition: Secondary | ICD-10-CM | POA: Diagnosis not present

## 2017-08-24 DIAGNOSIS — R351 Nocturia: Secondary | ICD-10-CM | POA: Diagnosis not present

## 2017-08-31 DIAGNOSIS — R3915 Urgency of urination: Secondary | ICD-10-CM | POA: Diagnosis not present

## 2017-08-31 DIAGNOSIS — R350 Frequency of micturition: Secondary | ICD-10-CM | POA: Diagnosis not present

## 2017-09-22 DIAGNOSIS — Z6834 Body mass index (BMI) 34.0-34.9, adult: Secondary | ICD-10-CM | POA: Diagnosis not present

## 2017-09-22 DIAGNOSIS — I700 Atherosclerosis of aorta: Secondary | ICD-10-CM | POA: Diagnosis not present

## 2017-09-22 DIAGNOSIS — I10 Essential (primary) hypertension: Secondary | ICD-10-CM | POA: Diagnosis not present

## 2017-09-22 DIAGNOSIS — E7849 Other hyperlipidemia: Secondary | ICD-10-CM | POA: Diagnosis not present

## 2017-09-22 DIAGNOSIS — W57XXXA Bitten or stung by nonvenomous insect and other nonvenomous arthropods, initial encounter: Secondary | ICD-10-CM | POA: Diagnosis not present

## 2017-09-22 DIAGNOSIS — M109 Gout, unspecified: Secondary | ICD-10-CM | POA: Diagnosis not present

## 2017-09-22 DIAGNOSIS — E668 Other obesity: Secondary | ICD-10-CM | POA: Diagnosis not present

## 2017-09-28 DIAGNOSIS — R3915 Urgency of urination: Secondary | ICD-10-CM | POA: Diagnosis not present

## 2017-09-28 DIAGNOSIS — R350 Frequency of micturition: Secondary | ICD-10-CM | POA: Diagnosis not present

## 2017-10-19 DIAGNOSIS — R350 Frequency of micturition: Secondary | ICD-10-CM | POA: Diagnosis not present

## 2017-10-19 DIAGNOSIS — R3915 Urgency of urination: Secondary | ICD-10-CM | POA: Diagnosis not present

## 2017-11-16 DIAGNOSIS — R350 Frequency of micturition: Secondary | ICD-10-CM | POA: Diagnosis not present

## 2017-11-23 DIAGNOSIS — Z6833 Body mass index (BMI) 33.0-33.9, adult: Secondary | ICD-10-CM | POA: Diagnosis not present

## 2017-11-23 DIAGNOSIS — L84 Corns and callosities: Secondary | ICD-10-CM | POA: Diagnosis not present

## 2017-11-23 DIAGNOSIS — L0889 Other specified local infections of the skin and subcutaneous tissue: Secondary | ICD-10-CM | POA: Diagnosis not present

## 2017-11-23 DIAGNOSIS — M19071 Primary osteoarthritis, right ankle and foot: Secondary | ICD-10-CM | POA: Diagnosis not present

## 2017-12-04 DIAGNOSIS — Z6834 Body mass index (BMI) 34.0-34.9, adult: Secondary | ICD-10-CM | POA: Diagnosis not present

## 2017-12-04 DIAGNOSIS — L84 Corns and callosities: Secondary | ICD-10-CM | POA: Diagnosis not present

## 2017-12-15 DIAGNOSIS — R3915 Urgency of urination: Secondary | ICD-10-CM | POA: Diagnosis not present

## 2018-01-04 DIAGNOSIS — H524 Presbyopia: Secondary | ICD-10-CM | POA: Diagnosis not present

## 2018-01-04 DIAGNOSIS — H401131 Primary open-angle glaucoma, bilateral, mild stage: Secondary | ICD-10-CM | POA: Diagnosis not present

## 2018-01-04 DIAGNOSIS — H26492 Other secondary cataract, left eye: Secondary | ICD-10-CM | POA: Diagnosis not present

## 2018-01-04 DIAGNOSIS — H43813 Vitreous degeneration, bilateral: Secondary | ICD-10-CM | POA: Diagnosis not present

## 2018-01-04 DIAGNOSIS — H353111 Nonexudative age-related macular degeneration, right eye, early dry stage: Secondary | ICD-10-CM | POA: Diagnosis not present

## 2018-01-09 ENCOUNTER — Other Ambulatory Visit (HOSPITAL_COMMUNITY): Payer: Self-pay | Admitting: Orthopedic Surgery

## 2018-01-09 ENCOUNTER — Ambulatory Visit (HOSPITAL_COMMUNITY)
Admission: RE | Admit: 2018-01-09 | Discharge: 2018-01-09 | Disposition: A | Discharge status: Home / Self Care | Payer: Medicare Other | Source: Ambulatory Visit | Attending: Cardiology | Admitting: Cardiology

## 2018-01-09 DIAGNOSIS — M79605 Pain in left leg: Secondary | ICD-10-CM | POA: Diagnosis not present

## 2018-01-09 DIAGNOSIS — M79604 Pain in right leg: Secondary | ICD-10-CM | POA: Insufficient documentation

## 2018-01-09 DIAGNOSIS — L84 Corns and callosities: Secondary | ICD-10-CM | POA: Diagnosis not present

## 2018-01-09 DIAGNOSIS — M79662 Pain in left lower leg: Secondary | ICD-10-CM | POA: Insufficient documentation

## 2018-01-09 DIAGNOSIS — M79661 Pain in right lower leg: Secondary | ICD-10-CM | POA: Diagnosis not present

## 2018-01-12 DIAGNOSIS — Z6833 Body mass index (BMI) 33.0-33.9, adult: Secondary | ICD-10-CM | POA: Diagnosis not present

## 2018-01-12 DIAGNOSIS — I809 Phlebitis and thrombophlebitis of unspecified site: Secondary | ICD-10-CM | POA: Diagnosis not present

## 2018-01-12 DIAGNOSIS — L84 Corns and callosities: Secondary | ICD-10-CM | POA: Diagnosis not present

## 2018-01-12 DIAGNOSIS — I10 Essential (primary) hypertension: Secondary | ICD-10-CM | POA: Diagnosis not present

## 2018-01-16 DIAGNOSIS — R3915 Urgency of urination: Secondary | ICD-10-CM | POA: Diagnosis not present

## 2018-05-03 DIAGNOSIS — E7849 Other hyperlipidemia: Secondary | ICD-10-CM | POA: Diagnosis not present

## 2018-05-03 DIAGNOSIS — Z0000 Encounter for general adult medical examination without abnormal findings: Secondary | ICD-10-CM | POA: Diagnosis not present

## 2018-05-03 DIAGNOSIS — M109 Gout, unspecified: Secondary | ICD-10-CM | POA: Diagnosis not present

## 2018-05-03 DIAGNOSIS — I10 Essential (primary) hypertension: Secondary | ICD-10-CM | POA: Diagnosis not present

## 2018-05-03 DIAGNOSIS — Z125 Encounter for screening for malignant neoplasm of prostate: Secondary | ICD-10-CM | POA: Diagnosis not present

## 2018-05-08 DIAGNOSIS — M25531 Pain in right wrist: Secondary | ICD-10-CM | POA: Diagnosis not present

## 2018-05-10 DIAGNOSIS — E7849 Other hyperlipidemia: Secondary | ICD-10-CM | POA: Diagnosis not present

## 2018-05-10 DIAGNOSIS — Z1389 Encounter for screening for other disorder: Secondary | ICD-10-CM | POA: Diagnosis not present

## 2018-05-10 DIAGNOSIS — Z6832 Body mass index (BMI) 32.0-32.9, adult: Secondary | ICD-10-CM | POA: Diagnosis not present

## 2018-05-10 DIAGNOSIS — I10 Essential (primary) hypertension: Secondary | ICD-10-CM | POA: Diagnosis not present

## 2018-05-10 DIAGNOSIS — M1990 Unspecified osteoarthritis, unspecified site: Secondary | ICD-10-CM | POA: Diagnosis not present

## 2018-05-10 DIAGNOSIS — I4510 Unspecified right bundle-branch block: Secondary | ICD-10-CM | POA: Diagnosis not present

## 2018-05-10 DIAGNOSIS — K219 Gastro-esophageal reflux disease without esophagitis: Secondary | ICD-10-CM | POA: Diagnosis not present

## 2018-05-10 DIAGNOSIS — Z0000 Encounter for general adult medical examination without abnormal findings: Secondary | ICD-10-CM | POA: Diagnosis not present

## 2018-05-10 DIAGNOSIS — N183 Chronic kidney disease, stage 3 (moderate): Secondary | ICD-10-CM | POA: Diagnosis not present

## 2018-05-10 DIAGNOSIS — R82998 Other abnormal findings in urine: Secondary | ICD-10-CM | POA: Diagnosis not present

## 2018-05-10 DIAGNOSIS — I700 Atherosclerosis of aorta: Secondary | ICD-10-CM | POA: Diagnosis not present

## 2018-05-10 DIAGNOSIS — M25531 Pain in right wrist: Secondary | ICD-10-CM | POA: Diagnosis not present

## 2018-05-10 DIAGNOSIS — E668 Other obesity: Secondary | ICD-10-CM | POA: Diagnosis not present

## 2018-06-13 DIAGNOSIS — M779 Enthesopathy, unspecified: Secondary | ICD-10-CM | POA: Diagnosis not present

## 2018-06-13 DIAGNOSIS — M25531 Pain in right wrist: Secondary | ICD-10-CM | POA: Diagnosis not present

## 2018-06-13 DIAGNOSIS — G5601 Carpal tunnel syndrome, right upper limb: Secondary | ICD-10-CM | POA: Diagnosis not present

## 2018-06-18 DIAGNOSIS — M779 Enthesopathy, unspecified: Secondary | ICD-10-CM | POA: Diagnosis not present

## 2018-06-18 DIAGNOSIS — G5601 Carpal tunnel syndrome, right upper limb: Secondary | ICD-10-CM | POA: Diagnosis not present

## 2018-06-18 DIAGNOSIS — M25531 Pain in right wrist: Secondary | ICD-10-CM | POA: Diagnosis not present

## 2018-06-21 DIAGNOSIS — M779 Enthesopathy, unspecified: Secondary | ICD-10-CM | POA: Diagnosis not present

## 2018-06-21 DIAGNOSIS — M25531 Pain in right wrist: Secondary | ICD-10-CM | POA: Diagnosis not present

## 2018-06-28 DIAGNOSIS — H401131 Primary open-angle glaucoma, bilateral, mild stage: Secondary | ICD-10-CM | POA: Diagnosis not present

## 2018-06-28 DIAGNOSIS — H43813 Vitreous degeneration, bilateral: Secondary | ICD-10-CM | POA: Diagnosis not present

## 2018-06-28 DIAGNOSIS — H353111 Nonexudative age-related macular degeneration, right eye, early dry stage: Secondary | ICD-10-CM | POA: Diagnosis not present

## 2018-06-28 DIAGNOSIS — Z961 Presence of intraocular lens: Secondary | ICD-10-CM | POA: Diagnosis not present

## 2018-07-04 DIAGNOSIS — L218 Other seborrheic dermatitis: Secondary | ICD-10-CM | POA: Diagnosis not present

## 2018-07-04 DIAGNOSIS — D692 Other nonthrombocytopenic purpura: Secondary | ICD-10-CM | POA: Diagnosis not present

## 2018-07-04 DIAGNOSIS — L720 Epidermal cyst: Secondary | ICD-10-CM | POA: Diagnosis not present

## 2018-07-04 DIAGNOSIS — L570 Actinic keratosis: Secondary | ICD-10-CM | POA: Diagnosis not present

## 2018-07-04 DIAGNOSIS — Z85828 Personal history of other malignant neoplasm of skin: Secondary | ICD-10-CM | POA: Diagnosis not present

## 2018-07-04 DIAGNOSIS — B353 Tinea pedis: Secondary | ICD-10-CM | POA: Diagnosis not present

## 2018-07-04 DIAGNOSIS — D1801 Hemangioma of skin and subcutaneous tissue: Secondary | ICD-10-CM | POA: Diagnosis not present

## 2018-07-04 DIAGNOSIS — L821 Other seborrheic keratosis: Secondary | ICD-10-CM | POA: Diagnosis not present

## 2018-07-04 DIAGNOSIS — D225 Melanocytic nevi of trunk: Secondary | ICD-10-CM | POA: Diagnosis not present

## 2018-08-07 DIAGNOSIS — H9193 Unspecified hearing loss, bilateral: Secondary | ICD-10-CM | POA: Diagnosis not present

## 2018-08-07 DIAGNOSIS — Z6831 Body mass index (BMI) 31.0-31.9, adult: Secondary | ICD-10-CM | POA: Diagnosis not present

## 2018-08-30 ENCOUNTER — Emergency Department (HOSPITAL_COMMUNITY): Payer: Medicare Other

## 2018-08-30 ENCOUNTER — Other Ambulatory Visit: Payer: Self-pay

## 2018-08-30 ENCOUNTER — Encounter (HOSPITAL_COMMUNITY): Payer: Self-pay

## 2018-08-30 ENCOUNTER — Inpatient Hospital Stay (HOSPITAL_COMMUNITY)
Admission: EM | Admit: 2018-08-30 | Discharge: 2018-09-01 | DRG: 030 | Disposition: A | Discharge status: Home Health | Payer: Medicare Other | Attending: Neurosurgery | Admitting: Neurosurgery

## 2018-08-30 DIAGNOSIS — I10 Essential (primary) hypertension: Secondary | ICD-10-CM | POA: Diagnosis present

## 2018-08-30 DIAGNOSIS — S12430A Unspecified traumatic displaced spondylolisthesis of fifth cervical vertebra, initial encounter for closed fracture: Secondary | ICD-10-CM | POA: Diagnosis not present

## 2018-08-30 DIAGNOSIS — Z91048 Other nonmedicinal substance allergy status: Secondary | ICD-10-CM

## 2018-08-30 DIAGNOSIS — S59902A Unspecified injury of left elbow, initial encounter: Secondary | ICD-10-CM | POA: Diagnosis not present

## 2018-08-30 DIAGNOSIS — S4992XA Unspecified injury of left shoulder and upper arm, initial encounter: Secondary | ICD-10-CM | POA: Diagnosis not present

## 2018-08-30 DIAGNOSIS — Z419 Encounter for procedure for purposes other than remedying health state, unspecified: Secondary | ICD-10-CM

## 2018-08-30 DIAGNOSIS — Z79899 Other long term (current) drug therapy: Secondary | ICD-10-CM

## 2018-08-30 DIAGNOSIS — M4802 Spinal stenosis, cervical region: Secondary | ICD-10-CM | POA: Diagnosis not present

## 2018-08-30 DIAGNOSIS — M25511 Pain in right shoulder: Secondary | ICD-10-CM | POA: Diagnosis present

## 2018-08-30 DIAGNOSIS — R350 Frequency of micturition: Secondary | ICD-10-CM | POA: Diagnosis not present

## 2018-08-30 DIAGNOSIS — M79601 Pain in right arm: Secondary | ICD-10-CM | POA: Diagnosis not present

## 2018-08-30 DIAGNOSIS — R52 Pain, unspecified: Secondary | ICD-10-CM | POA: Diagnosis not present

## 2018-08-30 DIAGNOSIS — K089 Disorder of teeth and supporting structures, unspecified: Secondary | ICD-10-CM | POA: Diagnosis present

## 2018-08-30 DIAGNOSIS — S199XXA Unspecified injury of neck, initial encounter: Secondary | ICD-10-CM | POA: Diagnosis not present

## 2018-08-30 DIAGNOSIS — M2578 Osteophyte, vertebrae: Secondary | ICD-10-CM | POA: Diagnosis present

## 2018-08-30 DIAGNOSIS — S12330A Unspecified traumatic displaced spondylolisthesis of fourth cervical vertebra, initial encounter for closed fracture: Secondary | ICD-10-CM | POA: Diagnosis present

## 2018-08-30 DIAGNOSIS — M4812 Ankylosing hyperostosis [Forestier], cervical region: Secondary | ICD-10-CM | POA: Diagnosis not present

## 2018-08-30 DIAGNOSIS — R531 Weakness: Secondary | ICD-10-CM | POA: Diagnosis present

## 2018-08-30 DIAGNOSIS — R351 Nocturia: Secondary | ICD-10-CM | POA: Diagnosis not present

## 2018-08-30 DIAGNOSIS — R404 Transient alteration of awareness: Secondary | ICD-10-CM | POA: Diagnosis not present

## 2018-08-30 DIAGNOSIS — R55 Syncope and collapse: Secondary | ICD-10-CM | POA: Diagnosis present

## 2018-08-30 DIAGNOSIS — R001 Bradycardia, unspecified: Secondary | ICD-10-CM | POA: Diagnosis present

## 2018-08-30 DIAGNOSIS — Z888 Allergy status to other drugs, medicaments and biological substances status: Secondary | ICD-10-CM

## 2018-08-30 DIAGNOSIS — N289 Disorder of kidney and ureter, unspecified: Secondary | ICD-10-CM | POA: Diagnosis present

## 2018-08-30 DIAGNOSIS — M25522 Pain in left elbow: Secondary | ICD-10-CM | POA: Diagnosis not present

## 2018-08-30 DIAGNOSIS — M1990 Unspecified osteoarthritis, unspecified site: Secondary | ICD-10-CM | POA: Diagnosis present

## 2018-08-30 DIAGNOSIS — S14101A Unspecified injury at C1 level of cervical spinal cord, initial encounter: Principal | ICD-10-CM | POA: Diagnosis present

## 2018-08-30 DIAGNOSIS — M79603 Pain in arm, unspecified: Secondary | ICD-10-CM | POA: Diagnosis not present

## 2018-08-30 DIAGNOSIS — M4312 Spondylolisthesis, cervical region: Secondary | ICD-10-CM | POA: Diagnosis present

## 2018-08-30 DIAGNOSIS — S299XXA Unspecified injury of thorax, initial encounter: Secondary | ICD-10-CM | POA: Diagnosis not present

## 2018-08-30 DIAGNOSIS — W1839XA Other fall on same level, initial encounter: Secondary | ICD-10-CM | POA: Diagnosis present

## 2018-08-30 DIAGNOSIS — M25519 Pain in unspecified shoulder: Secondary | ICD-10-CM | POA: Diagnosis not present

## 2018-08-30 DIAGNOSIS — Z87891 Personal history of nicotine dependence: Secondary | ICD-10-CM

## 2018-08-30 DIAGNOSIS — S4991XA Unspecified injury of right shoulder and upper arm, initial encounter: Secondary | ICD-10-CM | POA: Diagnosis not present

## 2018-08-30 DIAGNOSIS — I959 Hypotension, unspecified: Secondary | ICD-10-CM | POA: Diagnosis present

## 2018-08-30 DIAGNOSIS — I4510 Unspecified right bundle-branch block: Secondary | ICD-10-CM | POA: Diagnosis present

## 2018-08-30 HISTORY — DX: Essential (primary) hypertension: I10

## 2018-08-30 HISTORY — DX: Gout, unspecified: M10.9

## 2018-08-30 LAB — CBC WITH DIFFERENTIAL/PLATELET
ABS IMMATURE GRANULOCYTES: 0.03 10*3/uL (ref 0.00–0.07)
Basophils Absolute: 0 10*3/uL (ref 0.0–0.1)
Basophils Relative: 0 %
Eosinophils Absolute: 0.1 10*3/uL (ref 0.0–0.5)
Eosinophils Relative: 1 %
HCT: 45.8 % (ref 39.0–52.0)
Hemoglobin: 14.5 g/dL (ref 13.0–17.0)
Immature Granulocytes: 0 %
LYMPHS ABS: 2.1 10*3/uL (ref 0.7–4.0)
Lymphocytes Relative: 19 %
MCH: 29.5 pg (ref 26.0–34.0)
MCHC: 31.7 g/dL (ref 30.0–36.0)
MCV: 93.1 fL (ref 80.0–100.0)
MONO ABS: 0.7 10*3/uL (ref 0.1–1.0)
Monocytes Relative: 6 %
NEUTROS ABS: 8.1 10*3/uL — AB (ref 1.7–7.7)
NEUTROS PCT: 74 %
Platelets: 319 10*3/uL (ref 150–400)
RBC: 4.92 MIL/uL (ref 4.22–5.81)
RDW: 13.7 % (ref 11.5–15.5)
WBC: 11 10*3/uL — ABNORMAL HIGH (ref 4.0–10.5)
nRBC: 0 % (ref 0.0–0.2)

## 2018-08-30 LAB — BASIC METABOLIC PANEL
ANION GAP: 13 (ref 5–15)
BUN: 22 mg/dL (ref 8–23)
CO2: 24 mmol/L (ref 22–32)
Calcium: 9.2 mg/dL (ref 8.9–10.3)
Chloride: 102 mmol/L (ref 98–111)
Creatinine, Ser: 1.51 mg/dL — ABNORMAL HIGH (ref 0.61–1.24)
GFR calc Af Amer: 51 mL/min — ABNORMAL LOW (ref >60)
GFR calc non Af Amer: 44 mL/min — ABNORMAL LOW (ref >60)
Glucose, Bld: 134 mg/dL — ABNORMAL HIGH (ref 70–99)
POTASSIUM: 4 mmol/L (ref 3.5–5.1)
SODIUM: 139 mmol/L (ref 135–145)

## 2018-08-30 MED ORDER — ONDANSETRON HCL 4 MG/2ML IJ SOLN: 4.0000 mg | Freq: Once | INTRAMUSCULAR | Status: DC

## 2018-08-30 MED ORDER — HYDROCODONE-ACETAMINOPHEN 5-325 MG PO TABS
2.0000 | ORAL_TABLET | Freq: Once | ORAL | Status: AC
  Administered 2018-08-30: 2 via ORAL
  Filled 2018-08-30: qty 2

## 2018-08-30 MED ORDER — ONDANSETRON 4 MG PO TBDP
4.0000 mg | ORAL_TABLET | Freq: Once | ORAL | Status: AC
  Administered 2018-08-30: 4 mg via ORAL
  Filled 2018-08-30: qty 1

## 2018-08-30 MED ORDER — FENTANYL CITRATE (PF) 100 MCG/2ML IJ SOLN
50.0000 ug | INTRAMUSCULAR | Status: DC | PRN
Start: 2018-08-30 — End: 2018-09-01
  Administered 2018-08-30 (×2): 50 ug via INTRAVENOUS
  Administered 2018-08-31: 100 ug via INTRAVENOUS
  Administered 2018-08-31 (×3): 50 ug via INTRAVENOUS
  Filled 2018-08-30 (×4): qty 2

## 2018-08-30 MED ORDER — SODIUM CHLORIDE 0.9 % IV BOLUS: 1000.0000 mL | Freq: Once | INTRAVENOUS | Status: DC

## 2018-08-30 MED ORDER — FENTANYL CITRATE (PF) 100 MCG/2ML IJ SOLN
50.0000 ug | Freq: Once | INTRAMUSCULAR | Status: AC
  Administered 2018-08-30: 50 ug via INTRAVENOUS
  Filled 2018-08-30: qty 2

## 2018-08-30 NOTE — ED Provider Notes (Signed)
[facility] EMERGENCY DEPARTMENT Provider Note   CSN: [id number] Arrival date & time: 08/30/18  1836     History   Chief Complaint Chief Complaint  Patient presents with  . Arm Pain  . Loss of Consciousness    HPI Samuel Cortez is a 77 y.o. male brought in by EMS for mechanical fall.  Patient states that he was sitting down on a backless stool and missed the stool.  He had both of his arms behind him and fell onto her outstretched hands.  He had immediate severe pain in the right shoulder and the left elbow.  EMS arrived and states that the patient could barely tolerate getting onto the gurney and during that time he went from having a systolic pressure of 220 down to a systolic pressure of 70 with bradycardia in the 30s and had a syncopal event.  The paramedics suspect vasovagal reaction secondary to severe pain.  He was not given any pain medications due to the hypotension prior to arrival.  Upon arrival patient still had a low blood pressure and appeared pale and stated that he felt like he was going to pass out.  He is put in the Trendelenburg position and fluids were started after ultrasound-guided IV insertion.  The patient denies neck pain, loss of consciousness, chest pain, shortness of breath.  He does not take any blood thinning medications  HPI  Past Medical History:  Diagnosis Date  . Gout   . Hypertension     There are no active problems to display for this patient.   Past Surgical History:  Procedure Laterality Date  . TONSILLECTOMY          Home Medications    Prior to Admission medications   Not on File    Family History No family history on file.  Social History Social History   Tobacco Use  . Smoking status: Former Research scientist (life sciences)  . Smokeless tobacco: Never Used  Substance Use Topics  . Alcohol use: Yes    Alcohol/week: 2.0 standard drinks    Types: 1 Shots of liquor, 1 Glasses of wine per week  . Drug use: Never      Allergies   Patient has no known allergies.   Review of Systems Review of Systems  Ten systems reviewed and are negative for acute change, except as noted in the HPI.   Physical Exam Updated Vital Signs BP (!) 82/48   Pulse (!) 50   Temp 97.6 F (36.4 C) (Oral)   Resp 15   Ht 5\' 9"  (1.753 m)   Wt 95.3 kg   SpO2 100%   BMI 31.01 kg/m   Physical Exam Vitals signs and nursing note reviewed.  Constitutional:      General: He is not in acute distress.    Appearance: He is well-developed. He is not diaphoretic.  HENT:     Head: Normocephalic and atraumatic.  Eyes:     General: No scleral icterus.    Conjunctiva/sclera: Conjunctivae normal.  Neck:     Musculoskeletal: Normal range of motion and neck supple.  Cardiovascular:     Rate and Rhythm: Normal rate and regular rhythm.     Heart sounds: Normal heart sounds.  Pulmonary:     Effort: Pulmonary effort is normal. No respiratory distress.     Breath sounds: Normal breath sounds.  Abdominal:     Palpations: Abdomen is soft.     Tenderness: There is no abdominal tenderness.  Musculoskeletal:  General: Tenderness present. No swelling or deformity.     Comments: She is exquisitely tender to palpation over the right deltoid  Also exquisitely tender palpation over the left elbow.  Patient is difficult to assess because he does not want to be touched and threatens to hit this provider.  Skin:    General: Skin is warm.  Neurological:     Mental Status: He is alert.  Psychiatric:        Behavior: Behavior normal.      ED Treatments / Results  Labs (all labs ordered are listed, but only abnormal results are displayed) Labs Reviewed  CBC WITH DIFFERENTIAL/PLATELET - Abnormal; Notable for the following components:      Result Value   WBC 11.0 (*)    Neutro Abs 8.1 (*)    All other components within normal limits  BASIC METABOLIC PANEL    EKG None  Radiology No results  found.  Procedures Procedures (including critical care time)  Medications Ordered in ED Medications  ondansetron (ZOFRAN) injection 4 mg (0 mg Intravenous Hold 08/30/18 1914)  fentaNYL (SUBLIMAZE) injection 50 mcg (50 mcg Intravenous Given 08/30/18 1906)  ondansetron (ZOFRAN-ODT) disintegrating tablet 4 mg (4 mg Oral Given 08/30/18 1906)     Initial Impression / Assessment and Plan / ED Course  I have reviewed the triage vital signs and the nursing notes.  Pertinent labs & imaging results that were available during my care of the patient were reviewed by me and considered in my medical decision making (see chart for details).     Patient initial imaging of the shoulder and elbow are negative.  He has normal pulses in both wrists.  Patient having significant pain however and I stated that I felt he might need a CT of the neck.  CT returned with concerning findings of anterolisthesis of the C4-C5 vertebrae in the setting of thickened osteophytic complex along with questionable fracture fragments at the lateral right C4 vertebra.  I spoke with radiology about these findings which are difficult to read in the setting of such severe osteoarthritis but do have concern for potential acute fracture and potential unstable ligamentous injury.  I discussed this with the patient and told him he needs to be in a c-collar.  Patient does not want to stay for MRI however I had a long discussion with the patient he is currently making a decision as to whether or not he will stay.  I have given over care to PA Law who will follow up on the MRI should the patient choose to stay.  I did discuss all risks and benefits with the patient and have made it clear that if he decides not to stay for MRI he will need to leave [facility].  Final Clinical Impressions(s) / ED Diagnoses   Final diagnoses:  None    ED Discharge Orders    None       Margarita Mail, PA-C 08/31/18 [place], [place],  DO 08/31/18 0159

## 2018-08-30 NOTE — ED Triage Notes (Signed)
Pt arrived via [facility] from home after fall from stool. Pt arms were extended behind him when he fell c/o pain in both arms. On scene, EMS reports pt dropped his HR into the 30s and BP into the 70s. Pt given 400 NS PTA. Wife witnessed fall and reports that pt spoke with her during and after fall with no LOC until after EMS arrived.

## 2018-08-31 ENCOUNTER — Inpatient Hospital Stay (HOSPITAL_COMMUNITY): Payer: Medicare Other | Admitting: Certified Registered Nurse Anesthetist

## 2018-08-31 ENCOUNTER — Encounter (HOSPITAL_COMMUNITY)
Admission: EM | Disposition: A | Discharge status: Home Health | Payer: Self-pay | Source: Home / Self Care | Attending: Neurosurgery

## 2018-08-31 ENCOUNTER — Encounter (HOSPITAL_COMMUNITY): Payer: Self-pay | Admitting: Certified Registered Nurse Anesthetist

## 2018-08-31 ENCOUNTER — Inpatient Hospital Stay (HOSPITAL_COMMUNITY): Payer: Medicare Other

## 2018-08-31 ENCOUNTER — Emergency Department (HOSPITAL_COMMUNITY): Payer: Medicare Other

## 2018-08-31 DIAGNOSIS — M2578 Osteophyte, vertebrae: Secondary | ICD-10-CM | POA: Diagnosis present

## 2018-08-31 DIAGNOSIS — S12430A Unspecified traumatic displaced spondylolisthesis of fifth cervical vertebra, initial encounter for closed fracture: Secondary | ICD-10-CM | POA: Diagnosis present

## 2018-08-31 DIAGNOSIS — Z87891 Personal history of nicotine dependence: Secondary | ICD-10-CM | POA: Diagnosis not present

## 2018-08-31 DIAGNOSIS — S14135A Anterior cord syndrome at C5 level of cervical spinal cord, initial encounter: Secondary | ICD-10-CM | POA: Diagnosis not present

## 2018-08-31 DIAGNOSIS — G9520 Unspecified cord compression: Secondary | ICD-10-CM | POA: Diagnosis not present

## 2018-08-31 DIAGNOSIS — Z888 Allergy status to other drugs, medicaments and biological substances status: Secondary | ICD-10-CM | POA: Diagnosis not present

## 2018-08-31 DIAGNOSIS — R001 Bradycardia, unspecified: Secondary | ICD-10-CM | POA: Diagnosis present

## 2018-08-31 DIAGNOSIS — M1990 Unspecified osteoarthritis, unspecified site: Secondary | ICD-10-CM | POA: Diagnosis present

## 2018-08-31 DIAGNOSIS — Z981 Arthrodesis status: Secondary | ICD-10-CM | POA: Diagnosis not present

## 2018-08-31 DIAGNOSIS — M79601 Pain in right arm: Secondary | ICD-10-CM | POA: Diagnosis not present

## 2018-08-31 DIAGNOSIS — S12330A Unspecified traumatic displaced spondylolisthesis of fourth cervical vertebra, initial encounter for closed fracture: Secondary | ICD-10-CM | POA: Diagnosis present

## 2018-08-31 DIAGNOSIS — M4802 Spinal stenosis, cervical region: Secondary | ICD-10-CM | POA: Diagnosis present

## 2018-08-31 DIAGNOSIS — I4510 Unspecified right bundle-branch block: Secondary | ICD-10-CM | POA: Diagnosis present

## 2018-08-31 DIAGNOSIS — S13150A Subluxation of C4/C5 cervical vertebrae, initial encounter: Secondary | ICD-10-CM | POA: Diagnosis not present

## 2018-08-31 DIAGNOSIS — S14101A Unspecified injury at C1 level of cervical spinal cord, initial encounter: Secondary | ICD-10-CM | POA: Diagnosis present

## 2018-08-31 DIAGNOSIS — M4312 Spondylolisthesis, cervical region: Secondary | ICD-10-CM | POA: Diagnosis present

## 2018-08-31 DIAGNOSIS — R55 Syncope and collapse: Secondary | ICD-10-CM | POA: Diagnosis present

## 2018-08-31 DIAGNOSIS — I10 Essential (primary) hypertension: Secondary | ICD-10-CM | POA: Diagnosis present

## 2018-08-31 DIAGNOSIS — Z91048 Other nonmedicinal substance allergy status: Secondary | ICD-10-CM | POA: Diagnosis not present

## 2018-08-31 DIAGNOSIS — N289 Disorder of kidney and ureter, unspecified: Secondary | ICD-10-CM | POA: Diagnosis present

## 2018-08-31 DIAGNOSIS — M50021 Cervical disc disorder at C4-C5 level with myelopathy: Secondary | ICD-10-CM | POA: Diagnosis not present

## 2018-08-31 DIAGNOSIS — W1839XA Other fall on same level, initial encounter: Secondary | ICD-10-CM | POA: Diagnosis present

## 2018-08-31 DIAGNOSIS — S199XXA Unspecified injury of neck, initial encounter: Secondary | ICD-10-CM | POA: Diagnosis not present

## 2018-08-31 DIAGNOSIS — K089 Disorder of teeth and supporting structures, unspecified: Secondary | ICD-10-CM | POA: Diagnosis present

## 2018-08-31 DIAGNOSIS — S14134A Anterior cord syndrome at C4 level of cervical spinal cord, initial encounter: Secondary | ICD-10-CM | POA: Diagnosis not present

## 2018-08-31 DIAGNOSIS — M4812 Ankylosing hyperostosis [Forestier], cervical region: Secondary | ICD-10-CM | POA: Diagnosis present

## 2018-08-31 DIAGNOSIS — M25511 Pain in right shoulder: Secondary | ICD-10-CM | POA: Diagnosis present

## 2018-08-31 DIAGNOSIS — S14104A Unspecified injury at C4 level of cervical spinal cord, initial encounter: Secondary | ICD-10-CM | POA: Diagnosis not present

## 2018-08-31 DIAGNOSIS — I959 Hypotension, unspecified: Secondary | ICD-10-CM | POA: Diagnosis present

## 2018-08-31 DIAGNOSIS — Z79899 Other long term (current) drug therapy: Secondary | ICD-10-CM | POA: Diagnosis not present

## 2018-08-31 DIAGNOSIS — R531 Weakness: Secondary | ICD-10-CM | POA: Diagnosis present

## 2018-08-31 HISTORY — PX: ANTERIOR CERVICAL DECOMP/DISCECTOMY FUSION: SHX1161

## 2018-08-31 LAB — TYPE AND SCREEN
ABO/RH(D): O POS
Antibody Screen: NEGATIVE

## 2018-08-31 LAB — MRSA PCR SCREENING: MRSA by PCR: NEGATIVE

## 2018-08-31 LAB — ABO/RH: ABO/RH(D): O POS

## 2018-08-31 SURGERY — ANTERIOR CERVICAL DECOMPRESSION/DISCECTOMY FUSION 1 LEVEL
Anesthesia: General

## 2018-08-31 MED ORDER — IRBESARTAN 150 MG PO TABS
150.0000 mg | ORAL_TABLET | Freq: Every day | ORAL | Status: DC
  Administered 2018-09-01: 150 mg via ORAL
  Filled 2018-08-31: qty 1

## 2018-08-31 MED ORDER — KCL IN DEXTROSE-NACL 20-5-0.45 MEQ/L-%-% IV SOLN
INTRAVENOUS | Status: DC
  Administered 2018-08-31: 18:00:00 via INTRAVENOUS
  Filled 2018-08-31 (×2): qty 1000

## 2018-08-31 MED ORDER — POLYETHYLENE GLYCOL 3350 17 G PO PACK: 17.0000 g | PACK | Freq: Every day | ORAL | Status: DC | PRN

## 2018-08-31 MED ORDER — ONDANSETRON HCL 4 MG/2ML IJ SOLN: 4.0000 mg | Freq: Four times a day (QID) | INTRAMUSCULAR | Status: DC | PRN

## 2018-08-31 MED ORDER — ONDANSETRON HCL 4 MG PO TABS: 4.0000 mg | ORAL_TABLET | Freq: Four times a day (QID) | ORAL | Status: DC | PRN

## 2018-08-31 MED ORDER — LIDOCAINE-EPINEPHRINE 1 %-1:100000 IJ SOLN
INTRAMUSCULAR | Status: AC
  Filled 2018-08-31: qty 1

## 2018-08-31 MED ORDER — MORPHINE SULFATE (PF) 2 MG/ML IV SOLN
2.0000 mg | INTRAVENOUS | Status: DC | PRN
  Administered 2018-08-31: 2 mg via INTRAVENOUS
  Filled 2018-08-31: qty 1

## 2018-08-31 MED ORDER — KCL IN DEXTROSE-NACL 20-5-0.45 MEQ/L-%-% IV SOLN
INTRAVENOUS | Status: DC
  Administered 2018-08-31: 07:00:00 via INTRAVENOUS
  Filled 2018-08-31: qty 1000

## 2018-08-31 MED ORDER — ACETAMINOPHEN 650 MG RE SUPP: 650.0000 mg | Freq: Four times a day (QID) | RECTAL | Status: DC | PRN

## 2018-08-31 MED ORDER — DEXAMETHASONE SODIUM PHOSPHATE 4 MG/ML IJ SOLN
4.0000 mg | Freq: Four times a day (QID) | INTRAMUSCULAR | Status: AC
  Administered 2018-08-31 – 2018-09-01 (×2): 4 mg via INTRAVENOUS
  Filled 2018-08-31 (×2): qty 1

## 2018-08-31 MED ORDER — ACETAMINOPHEN 650 MG RE SUPP: 650.0000 mg | RECTAL | Status: DC | PRN

## 2018-08-31 MED ORDER — ZOLPIDEM TARTRATE 5 MG PO TABS: 5.0000 mg | ORAL_TABLET | Freq: Every evening | ORAL | Status: DC | PRN

## 2018-08-31 MED ORDER — OXYCODONE HCL 5 MG PO TABS
5.0000 mg | ORAL_TABLET | ORAL | Status: DC | PRN
  Administered 2018-08-31 – 2018-09-01 (×3): 5 mg via ORAL
  Filled 2018-08-31 (×3): qty 1

## 2018-08-31 MED ORDER — GABAPENTIN 300 MG PO CAPS
300.0000 mg | ORAL_CAPSULE | Freq: Three times a day (TID) | ORAL | Status: DC
  Administered 2018-08-31 – 2018-09-01 (×3): 300 mg via ORAL
  Filled 2018-08-31 (×3): qty 1

## 2018-08-31 MED ORDER — DEXAMETHASONE 4 MG PO TABS
4.0000 mg | ORAL_TABLET | Freq: Four times a day (QID) | ORAL | Status: AC
  Administered 2018-09-01 (×2): 4 mg via ORAL
  Filled 2018-08-31 (×2): qty 1

## 2018-08-31 MED ORDER — CEFAZOLIN SODIUM-DEXTROSE 2-4 GM/100ML-% IV SOLN
INTRAVENOUS | Status: AC
  Filled 2018-08-31: qty 100

## 2018-08-31 MED ORDER — BUPIVACAINE HCL (PF) 0.5 % IJ SOLN
INTRAMUSCULAR | Status: DC | PRN
  Administered 2018-08-31: 5 mL

## 2018-08-31 MED ORDER — MIDAZOLAM HCL 2 MG/2ML IJ SOLN
INTRAMUSCULAR | Status: AC
  Filled 2018-08-31: qty 2

## 2018-08-31 MED ORDER — LIDOCAINE-EPINEPHRINE 1 %-1:100000 IJ SOLN
INTRAMUSCULAR | Status: DC | PRN
  Administered 2018-08-31: 5 mL

## 2018-08-31 MED ORDER — FLEET ENEMA 7-19 GM/118ML RE ENEM: 1.0000 | ENEMA | Freq: Once | RECTAL | Status: DC | PRN

## 2018-08-31 MED ORDER — HYDROMORPHONE HCL 1 MG/ML IJ SOLN: 0.2500 mg | INTRAMUSCULAR | Status: DC | PRN

## 2018-08-31 MED ORDER — PROPOFOL 10 MG/ML IV BOLUS
INTRAVENOUS | Status: AC
  Filled 2018-08-31: qty 20

## 2018-08-31 MED ORDER — SUGAMMADEX SODIUM 200 MG/2ML IV SOLN
INTRAVENOUS | Status: DC | PRN
  Administered 2018-08-31: 200 mg via INTRAVENOUS

## 2018-08-31 MED ORDER — SODIUM CHLORIDE 0.9% FLUSH
3.0000 mL | Freq: Two times a day (BID) | INTRAVENOUS | Status: DC
  Administered 2018-08-31 – 2018-09-01 (×2): 3 mL via INTRAVENOUS

## 2018-08-31 MED ORDER — LIDOCAINE 2% (20 MG/ML) 5 ML SYRINGE
INTRAMUSCULAR | Status: DC | PRN
  Administered 2018-08-31: 100 mg via INTRAVENOUS

## 2018-08-31 MED ORDER — HYDROCODONE-ACETAMINOPHEN 5-325 MG PO TABS: 1.0000 | ORAL_TABLET | ORAL | Status: DC | PRN

## 2018-08-31 MED ORDER — ACETAMINOPHEN 325 MG PO TABS: 650.0000 mg | ORAL_TABLET | Freq: Four times a day (QID) | ORAL | Status: DC | PRN

## 2018-08-31 MED ORDER — POLYVINYL ALCOHOL 1.4 % OP SOLN: 2.0000 [drp] | OPHTHALMIC | Status: DC | PRN

## 2018-08-31 MED ORDER — SODIUM CHLORIDE 0.9% FLUSH: 3.0000 mL | INTRAVENOUS | Status: DC | PRN

## 2018-08-31 MED ORDER — SODIUM CHLORIDE 0.9 % IV SOLN
INTRAVENOUS | Status: DC | PRN
  Administered 2018-08-31: 50 ug/min via INTRAVENOUS
  Administered 2018-08-31: 40 ug/min via INTRAVENOUS

## 2018-08-31 MED ORDER — BISACODYL 10 MG RE SUPP: 10.0000 mg | Freq: Every day | RECTAL | Status: DC | PRN

## 2018-08-31 MED ORDER — FENTANYL CITRATE (PF) 250 MCG/5ML IJ SOLN
INTRAMUSCULAR | Status: AC
  Filled 2018-08-31: qty 5

## 2018-08-31 MED ORDER — 0.9 % SODIUM CHLORIDE (POUR BTL) OPTIME
TOPICAL | Status: DC | PRN
  Administered 2018-08-31: 1000 mL

## 2018-08-31 MED ORDER — THROMBIN 5000 UNITS EX SOLR
OROMUCOSAL | Status: DC | PRN
  Administered 2018-08-31: 12:00:00 via TOPICAL

## 2018-08-31 MED ORDER — ONDANSETRON HCL 4 MG/2ML IJ SOLN
INTRAMUSCULAR | Status: DC | PRN
  Administered 2018-08-31: 4 mg via INTRAVENOUS

## 2018-08-31 MED ORDER — ROCURONIUM BROMIDE 50 MG/5ML IV SOSY
PREFILLED_SYRINGE | INTRAVENOUS | Status: AC
  Filled 2018-08-31: qty 15

## 2018-08-31 MED ORDER — METHOCARBAMOL 500 MG PO TABS
500.0000 mg | ORAL_TABLET | Freq: Four times a day (QID) | ORAL | Status: DC | PRN
  Administered 2018-09-01: 500 mg via ORAL
  Filled 2018-08-31: qty 1

## 2018-08-31 MED ORDER — GABAPENTIN 300 MG PO CAPS: 300.0000 mg | ORAL_CAPSULE | Freq: Three times a day (TID) | ORAL | Status: DC

## 2018-08-31 MED ORDER — MORPHINE SULFATE (PF) 2 MG/ML IV SOLN: 2.0000 mg | INTRAVENOUS | Status: DC | PRN

## 2018-08-31 MED ORDER — CEFAZOLIN SODIUM-DEXTROSE 2-4 GM/100ML-% IV SOLN
2.0000 g | Freq: Three times a day (TID) | INTRAVENOUS | Status: AC
  Administered 2018-08-31 – 2018-09-01 (×2): 2 g via INTRAVENOUS
  Filled 2018-08-31 (×2): qty 100

## 2018-08-31 MED ORDER — LACTATED RINGERS IV SOLN
INTRAVENOUS | Status: DC | PRN
  Administered 2018-08-31 (×2): via INTRAVENOUS

## 2018-08-31 MED ORDER — ACETAMINOPHEN 500 MG PO TABS: 500.0000 mg | ORAL_TABLET | Freq: Four times a day (QID) | ORAL | Status: DC | PRN

## 2018-08-31 MED ORDER — ACETAMINOPHEN 10 MG/ML IV SOLN
INTRAVENOUS | Status: DC | PRN
  Administered 2018-08-31: 1000 mg via INTRAVENOUS

## 2018-08-31 MED ORDER — EPHEDRINE SULFATE-NACL 50-0.9 MG/10ML-% IV SOSY
PREFILLED_SYRINGE | INTRAVENOUS | Status: DC | PRN
  Administered 2018-08-31: 5 mg via INTRAVENOUS
  Administered 2018-08-31: 10 mg via INTRAVENOUS
  Administered 2018-08-31: 5 mg via INTRAVENOUS

## 2018-08-31 MED ORDER — CEFAZOLIN SODIUM-DEXTROSE 2-3 GM-%(50ML) IV SOLR
INTRAVENOUS | Status: DC | PRN
  Administered 2018-08-31: 2 g via INTRAVENOUS

## 2018-08-31 MED ORDER — OXYCODONE HCL 5 MG PO TABS
10.0000 mg | ORAL_TABLET | ORAL | Status: DC | PRN
  Administered 2018-09-01: 10 mg via ORAL
  Filled 2018-08-31: qty 2

## 2018-08-31 MED ORDER — PHENOL 1.4 % MT LIQD: 1.0000 | OROMUCOSAL | Status: DC | PRN

## 2018-08-31 MED ORDER — ALUM & MAG HYDROXIDE-SIMETH 200-200-20 MG/5ML PO SUSP: 30.0000 mL | Freq: Four times a day (QID) | ORAL | Status: DC | PRN

## 2018-08-31 MED ORDER — METHOCARBAMOL 1000 MG/10ML IJ SOLN
500.0000 mg | Freq: Four times a day (QID) | INTRAVENOUS | Status: DC | PRN
  Filled 2018-08-31: qty 5

## 2018-08-31 MED ORDER — EPHEDRINE 5 MG/ML INJ
INTRAVENOUS | Status: AC
  Filled 2018-08-31: qty 10

## 2018-08-31 MED ORDER — DOCUSATE SODIUM 100 MG PO CAPS: 100.0000 mg | ORAL_CAPSULE | Freq: Two times a day (BID) | ORAL | Status: DC

## 2018-08-31 MED ORDER — LACTATED RINGERS IV SOLN
INTRAVENOUS | Status: DC
  Administered 2018-08-31: 10:00:00 via INTRAVENOUS

## 2018-08-31 MED ORDER — TELMISARTAN-HCTZ 40-12.5 MG PO TABS: 1.0000 | ORAL_TABLET | Freq: Every day | ORAL | Status: DC

## 2018-08-31 MED ORDER — LATANOPROST 0.005 % OP SOLN
1.0000 [drp] | Freq: Every day | OPHTHALMIC | Status: DC
  Filled 2018-08-31: qty 2.5

## 2018-08-31 MED ORDER — OXYCODONE HCL 5 MG PO TABS: 5.0000 mg | ORAL_TABLET | Freq: Once | ORAL | Status: DC | PRN

## 2018-08-31 MED ORDER — ONDANSETRON HCL 4 MG/2ML IJ SOLN: 4.0000 mg | Freq: Once | INTRAMUSCULAR | Status: DC | PRN

## 2018-08-31 MED ORDER — ROCURONIUM BROMIDE 50 MG/5ML IV SOSY
PREFILLED_SYRINGE | INTRAVENOUS | Status: DC | PRN
  Administered 2018-08-31: 15 mg via INTRAVENOUS
  Administered 2018-08-31: 50 mg via INTRAVENOUS

## 2018-08-31 MED ORDER — DOCUSATE SODIUM 100 MG PO CAPS
100.0000 mg | ORAL_CAPSULE | Freq: Two times a day (BID) | ORAL | Status: DC
  Administered 2018-08-31 (×2): 100 mg via ORAL
  Filled 2018-08-31 (×3): qty 1

## 2018-08-31 MED ORDER — PHENYLEPHRINE 40 MCG/ML (10ML) SYRINGE FOR IV PUSH (FOR BLOOD PRESSURE SUPPORT)
PREFILLED_SYRINGE | INTRAVENOUS | Status: AC
  Filled 2018-08-31: qty 10

## 2018-08-31 MED ORDER — ONDANSETRON HCL 4 MG/2ML IJ SOLN
INTRAMUSCULAR | Status: AC
  Filled 2018-08-31: qty 2

## 2018-08-31 MED ORDER — BUPIVACAINE HCL (PF) 0.5 % IJ SOLN
INTRAMUSCULAR | Status: AC
  Filled 2018-08-31: qty 30

## 2018-08-31 MED ORDER — OXYCODONE HCL 5 MG/5ML PO SOLN: 5.0000 mg | Freq: Once | ORAL | Status: DC | PRN

## 2018-08-31 MED ORDER — PROPOFOL 10 MG/ML IV BOLUS
INTRAVENOUS | Status: DC | PRN
  Administered 2018-08-31: 120 mg via INTRAVENOUS

## 2018-08-31 MED ORDER — INFLUENZA VAC SPLIT HIGH-DOSE 0.5 ML IM SUSY
0.5000 mL | PREFILLED_SYRINGE | INTRAMUSCULAR | Status: DC
  Filled 2018-08-31: qty 0.5

## 2018-08-31 MED ORDER — PHENYLEPHRINE HCL 10 MG/ML IJ SOLN
INTRAMUSCULAR | Status: DC | PRN
  Administered 2018-08-31 (×2): 120 ug via INTRAVENOUS

## 2018-08-31 MED ORDER — THROMBIN 5000 UNITS EX SOLR
CUTANEOUS | Status: AC
  Filled 2018-08-31: qty 5000

## 2018-08-31 MED ORDER — AMLODIPINE BESYLATE 2.5 MG PO TABS: 2.5000 mg | ORAL_TABLET | Freq: Every day | ORAL | Status: DC

## 2018-08-31 MED ORDER — HYDROCHLOROTHIAZIDE 12.5 MG PO CAPS
12.5000 mg | ORAL_CAPSULE | Freq: Every day | ORAL | Status: DC
  Administered 2018-09-01: 12.5 mg via ORAL
  Filled 2018-08-31: qty 1

## 2018-08-31 MED ORDER — DEXAMETHASONE SODIUM PHOSPHATE 10 MG/ML IJ SOLN
INTRAMUSCULAR | Status: DC | PRN
  Administered 2018-08-31: 10 mg via INTRAVENOUS

## 2018-08-31 MED ORDER — LIDOCAINE 2% (20 MG/ML) 5 ML SYRINGE
INTRAMUSCULAR | Status: AC
  Filled 2018-08-31: qty 5

## 2018-08-31 MED ORDER — ALLOPURINOL 300 MG PO TABS: 300.0000 mg | ORAL_TABLET | Freq: Every day | ORAL | Status: DC

## 2018-08-31 MED ORDER — MENTHOL 3 MG MT LOZG: 1.0000 | LOZENGE | OROMUCOSAL | Status: DC | PRN

## 2018-08-31 MED ORDER — ACETAMINOPHEN 325 MG PO TABS
650.0000 mg | ORAL_TABLET | ORAL | Status: DC | PRN
  Administered 2018-09-01 (×2): 650 mg via ORAL
  Filled 2018-08-31 (×2): qty 2

## 2018-08-31 MED ORDER — ACETAMINOPHEN 10 MG/ML IV SOLN
INTRAVENOUS | Status: AC
  Filled 2018-08-31: qty 100

## 2018-08-31 MED ORDER — MIDAZOLAM HCL 5 MG/5ML IJ SOLN
INTRAMUSCULAR | Status: DC | PRN
  Administered 2018-08-31 (×2): 1 mg via INTRAVENOUS

## 2018-08-31 MED ORDER — DEXAMETHASONE SODIUM PHOSPHATE 10 MG/ML IJ SOLN
INTRAMUSCULAR | Status: AC
  Filled 2018-08-31: qty 1

## 2018-08-31 MED ORDER — SODIUM CHLORIDE 0.9 % IV SOLN: 250.0000 mL | INTRAVENOUS | Status: DC

## 2018-08-31 SURGICAL SUPPLY — 68 items
ADH SKN CLS APL DERMABOND .7 (GAUZE/BANDAGES/DRESSINGS) ×1
BASKET BONE COLLECTION (BASKET) ×2 IMPLANT
BIT DRILL AVIATOR 16 (BIT) ×1 IMPLANT
BIT DRILL AVIATOR 16MM (BIT) ×1
BIT DRILL NEURO 2X3.1 SFT TUCH (MISCELLANEOUS) ×1 IMPLANT
BLADE ULTRA TIP 2M (BLADE) IMPLANT
BNDG GAUZE ELAST 4 BULKY (GAUZE/BANDAGES/DRESSINGS) IMPLANT
BUR BARREL STRAIGHT FLUTE 4.0 (BURR) ×3 IMPLANT
CANISTER SUCT 3000ML PPV (MISCELLANEOUS) ×3 IMPLANT
CARTRIDGE OIL MAESTRO DRILL (MISCELLANEOUS) ×1 IMPLANT
COVER MAYO STAND STRL (DRAPES) ×3 IMPLANT
COVER WAND RF STERILE (DRAPES) ×3 IMPLANT
DECANTER SPIKE VIAL GLASS SM (MISCELLANEOUS) ×1 IMPLANT
DERMABOND ADVANCED (GAUZE/BANDAGES/DRESSINGS) ×2
DERMABOND ADVANCED .7 DNX12 (GAUZE/BANDAGES/DRESSINGS) ×1 IMPLANT
DIFFUSER DRILL AIR PNEUMATIC (MISCELLANEOUS) ×3 IMPLANT
DRAPE HALF SHEET 40X57 (DRAPES) ×2 IMPLANT
DRAPE LAPAROTOMY 100X72 PEDS (DRAPES) ×3 IMPLANT
DRAPE MICROSCOPE LEICA (MISCELLANEOUS) ×3 IMPLANT
DRILL NEURO 2X3.1 SOFT TOUCH (MISCELLANEOUS) ×3
DRSG OPSITE POSTOP 3X4 (GAUZE/BANDAGES/DRESSINGS) ×2 IMPLANT
DURAPREP 6ML APPLICATOR 50/CS (WOUND CARE) ×3 IMPLANT
ELECT COATED BLADE 2.86 ST (ELECTRODE) ×3 IMPLANT
ELECT REM PT RETURN 9FT ADLT (ELECTROSURGICAL) ×3
ELECTRODE REM PT RTRN 9FT ADLT (ELECTROSURGICAL) ×1 IMPLANT
GAUZE 4X4 16PLY RFD (DISPOSABLE) IMPLANT
GAUZE SPONGE 4X4 12PLY STRL (GAUZE/BANDAGES/DRESSINGS) IMPLANT
GLOVE BIO SURGEON STRL SZ7.5 (GLOVE) ×2 IMPLANT
GLOVE BIO SURGEON STRL SZ8 (GLOVE) ×3 IMPLANT
GLOVE BIOGEL PI IND STRL 7.5 (GLOVE) IMPLANT
GLOVE BIOGEL PI IND STRL 8 (GLOVE) ×1 IMPLANT
GLOVE BIOGEL PI IND STRL 8.5 (GLOVE) ×1 IMPLANT
GLOVE BIOGEL PI INDICATOR 7.5 (GLOVE) ×2
GLOVE BIOGEL PI INDICATOR 8 (GLOVE) ×2
GLOVE BIOGEL PI INDICATOR 8.5 (GLOVE) ×2
GLOVE ECLIPSE 8.0 STRL XLNG CF (GLOVE) ×3 IMPLANT
GLOVE EXAM NITRILE XL STR (GLOVE) IMPLANT
GOWN STRL REUS W/ TWL LRG LVL3 (GOWN DISPOSABLE) IMPLANT
GOWN STRL REUS W/ TWL XL LVL3 (GOWN DISPOSABLE) IMPLANT
GOWN STRL REUS W/TWL 2XL LVL3 (GOWN DISPOSABLE) IMPLANT
GOWN STRL REUS W/TWL LRG LVL3 (GOWN DISPOSABLE)
GOWN STRL REUS W/TWL XL LVL3 (GOWN DISPOSABLE)
HALTER HD/CHIN CERV TRACTION D (MISCELLANEOUS) ×3 IMPLANT
HEMOSTAT POWDER KIT SURGIFOAM (HEMOSTASIS) ×3 IMPLANT
KIT BASIN OR (CUSTOM PROCEDURE TRAY) ×3 IMPLANT
KIT TURNOVER KIT B (KITS) ×3 IMPLANT
NDL HYPO 25X1 1.5 SAFETY (NEEDLE) ×1 IMPLANT
NDL SPNL 22GX3.5 QUINCKE BK (NEEDLE) ×1 IMPLANT
NEEDLE HYPO 25X1 1.5 SAFETY (NEEDLE) ×3 IMPLANT
NEEDLE SPNL 22GX3.5 QUINCKE BK (NEEDLE) ×3 IMPLANT
NS IRRIG 1000ML POUR BTL (IV SOLUTION) ×3 IMPLANT
OIL CARTRIDGE MAESTRO DRILL (MISCELLANEOUS) ×3
PACK LAMINECTOMY NEURO (CUSTOM PROCEDURE TRAY) ×3 IMPLANT
PAD ARMBOARD 7.5X6 YLW CONV (MISCELLANEOUS) ×9 IMPLANT
PEEK SPACER AVS AS 7X14X16X4% (Peek) ×2 IMPLANT
PIN DISTRACTION 14MM (PIN) ×6 IMPLANT
PLATE AVIATOR ASSY 1LVL SZ 16 (Plate) ×2 IMPLANT
RUBBERBAND STERILE (MISCELLANEOUS) ×6 IMPLANT
SCREW ANT SD AVIATOR 4X16 (Screw) ×8 IMPLANT
SCREW AVIAT VAR SLFTAP 4.35X16 (Screw) ×2 IMPLANT
SCREW AVIATOR VAR SELFTAP 4X16 (Screw) ×2 IMPLANT
SPONGE INTESTINAL PEANUT (DISPOSABLE) ×3 IMPLANT
SPONGE SURGIFOAM ABS GEL SZ50 (HEMOSTASIS) IMPLANT
STAPLER SKIN PROX WIDE 3.9 (STAPLE) ×2 IMPLANT
SUT VIC AB 3-0 SH 8-18 (SUTURE) ×6 IMPLANT
TOWEL GREEN STERILE (TOWEL DISPOSABLE) ×3 IMPLANT
TOWEL GREEN STERILE FF (TOWEL DISPOSABLE) ×3 IMPLANT
WATER STERILE IRR 1000ML POUR (IV SOLUTION) ×3 IMPLANT

## 2018-08-31 NOTE — ED Provider Notes (Signed)
Signout from [place], PA-C at shift change See previous provider note for full H&P  Briefly, patient fell off a stool and hit his back on a magazine rack. He now has severe pain in his shoulder and radiating down his arms. X-rays are negative. CT of the cervical spine saw possible fracture, maybe chronic. At my assumption of care, MRI pending.  On my exam, patient has decreased strength on his right upper extremity. He has normal sensation throughout. 1+ patellar reflexes, but patient unable to tolerate UE reflex testing. Patient very tender over bilateral deltoids (R>L) and volar aspects of the arms.  Patient requiring more IV pain medication. Given fentanyl 50 mcg.   MRI shows: 1. Mild edema within anterior C4-5 bridging osteophytes and prevertebral soft tissues compatible with osteophyte fracture. No vertebral body or facet fracture. 2. Grade 1 C4-5 anterolisthesis. 3. C4-5 disc protrusion with annular fissure impinging anterior cord. Mild C4-5 increased right anterior cord signal, likely representing mild cord contusion. Moderate C4-5 spinal canal stenosis. 4. DISH of visible cervical and thoracic spine.  Patient strongly encouraged to wear previously advised, but declined, c-collar at the time of results and patient agrees. Aspen collar placed.  I discussed patient case with neurosurgeon on call, Dr. Vertell Limber, who evaluated the patient and will admit for surgery. I appreciate his assistance with this case.   Frederica Kuster, PA-C 08/31/18 [place], [place], DO 08/31/18 1713

## 2018-08-31 NOTE — ED Provider Notes (Signed)
Medical screening examination/treatment/procedure(s) were conducted as a shared visit with non-physician practitioner(s) and myself.  I personally evaluated the patient during the encounter. Briefly, the patient is a 77 y.o. male is a 77 year old male with history of arthritis who presents to the ED after mechanical fall.  Patient normal vitals.  No fever.  Patient had vagal event while being transferred to his bed.  Blood pressure improved on own.  EKG shows sinus rhythm.  No signs of ischemic changes.  Lab work was unremarkable.  Patient states that he fell backward hitting his right shoulder, left elbow on a metal stand.  X-rays were obtained that did not show any acute fractures.  However patient did start to complain of nervelike pain down the radial side of his right arm.  Patient with normal strength on exam.  No midline spinal pain.  Patient denies hitting his head/neck, no specific neck pain.  However, given numbness down the radial side of his right arm CT scan was obtained and showed concern for possible ligamentous injury at C4 and C5 with possible posterior facet fracture at C4.  MRI was ordered and patient was signed out to oncoming ED staff with patient pending MRI read.  Patient was placed in a collar prior to MRI.  Hemodynamically stable throughout my care.  This chart was dictated using voice recognition software.  Despite best efforts to proofread,  errors can occur which can change the documentation meaning.    EKG Interpretation  Date/Time:  Thursday August 30 2018 22:09:07 EST Ventricular Rate:  67 PR Interval:    QRS Duration: 138 QT Interval:  454 QTC Calculation: 480 R Axis:   -12 Text Interpretation:  Sinus rhythm Right bundle branch block Confirmed by Lennice Sites [phone]) on 08/31/2018 2:06:19 AM        EMERGENCY DEPARTMENT  US GUIDANCE EXAM Emergency Ultrasound:  US Guidance for Needle Guidance  INDICATIONS: Difficult vascular access Linear probe used in  real-time to visualize location of needle entry through skin.   PERFORMED BY: Myself IMAGES ARCHIVED?: Yes LIMITATIONS: Pain VIEWS USED: Transverse INTERPRETATION: Left arm needle placement     Lennice Sites, DO 08/31/18 0206

## 2018-08-31 NOTE — ED Notes (Addendum)
Neurosurgery @ bedside Samuel Cortez)

## 2018-08-31 NOTE — Transfer of Care (Signed)
Immediate Anesthesia Transfer of Care Note  Patient: Samuel Cortez  Procedure(s) Performed: ANTERIOR CERVICAL DECOMPRESSION/DISCECTOMY FUSION CERVICAL FOUR-CERVICAL FIVE (N/A )  Patient Location: PACU  Anesthesia Type:General  Level of Consciousness: drowsy  Airway & Oxygen Therapy: Patient Spontanous Breathing and Patient connected to face mask oxygen  Post-op Assessment: Report given to RN and Post -op Vital signs reviewed and stable  Post vital signs: Reviewed and stable  Last Vitals:  Vitals Value Taken Time  BP 152/80 08/31/2018  1:53 PM  Temp    Pulse 87 08/31/2018  1:54 PM  Resp 15 08/31/2018  1:54 PM  SpO2 97 % 08/31/2018  1:54 PM  Vitals shown include unvalidated device data.  Last Pain:  Vitals:   08/31/18 0805  TempSrc: Oral  PainSc:          Complications: No apparent anesthesia complications

## 2018-08-31 NOTE — Anesthesia Procedure Notes (Addendum)
Procedure Name: Intubation Date/Time: 08/31/2018 11:29 AM Performed by: Inda Coke, CRNA Pre-anesthesia Checklist: Patient identified, Emergency Drugs available, Suction available and Patient being monitored Patient Re-evaluated:Patient Re-evaluated prior to induction Oxygen Delivery Method: Circle System Utilized Preoxygenation: Pre-oxygenation with 100% oxygen Induction Type: IV induction Ventilation: Mask ventilation without difficulty and Oral airway inserted - appropriate to patient size Laryngoscope Size: Glidescope and 4 Grade View: Grade I Tube type: Oral Number of attempts: 1 Airway Equipment and Method: Oral airway,  Video-laryngoscopy and Rigid stylet Placement Confirmation: ETT inserted through vocal cords under direct vision,  positive ETCO2 and breath sounds checked- equal and bilateral Secured at: 22 cm Tube secured with: Tape Dental Injury: Teeth and Oropharynx as per pre-operative assessment  Comments: Elective glidescope intubation due to cervical fracture. DL X 1 with glidescope blade MAC 4 grade I and smooth intubation. Maintained head and neck neutral and midline throughout intubation

## 2018-08-31 NOTE — Anesthesia Postprocedure Evaluation (Signed)
Anesthesia Post Note  Patient: Samuel Cortez  Procedure(s) Performed: ANTERIOR CERVICAL DECOMPRESSION/DISCECTOMY FUSION CERVICAL FOUR-CERVICAL FIVE (N/A )     Patient location during evaluation: PACU Anesthesia Type: General Level of consciousness: awake and alert Pain management: pain level controlled Vital Signs Assessment: post-procedure vital signs reviewed and stable Respiratory status: spontaneous breathing, nonlabored ventilation and respiratory function stable Cardiovascular status: blood pressure returned to baseline and stable Postop Assessment: no apparent nausea or vomiting Anesthetic complications: no    Last Vitals:  Vitals:   08/31/18 1445 08/31/18 1510  BP:  133/74  Pulse: 70   Resp: 16   Temp:    SpO2: 94%     Last Pain:  Vitals:   08/31/18 1534  TempSrc:   PainSc: 5                  [name]

## 2018-08-31 NOTE — Brief Op Note (Signed)
08/31/2018  1:42 PM  PATIENT:  Samuel Cortez  77 y.o. male  PRE-OPERATIVE DIAGNOSIS:  spinal cord injury C 45 with spondylolisthesis, stenosis, myelopathy, herniated cervical disc and fracture, DISH  POST-OPERATIVE DIAGNOSIS: spinal cord injury C 45 with spondylolisthesis, stenosis, myelopathy, herniated cervical disc and fracture, DISH   PROCEDURE:  Procedure(s) with comments: ANTERIOR CERVICAL DECOMPRESSION/DISCECTOMY FUSION CERVICAL FOUR-CERVICAL FIVE (N/A) - ANTERIOR CERVICAL DECOMPRESSION/DISCECTOMY FUSION CERVICAL FOUR-CERVICAL FIVE with PEEK cage, autograft, anterior cervical plate  SURGEON:  Surgeon(s) and Role:    Erline Levine, MD - Primary    * Judith Part, MD - Assisting  PHYSICIAN ASSISTANT:   ASSISTANTS: Poteat, RN   ANESTHESIA:   general  EBL:  25 mL   BLOOD ADMINISTERED:none  DRAINS: none   LOCAL MEDICATIONS USED:  MARCAINE    and LIDOCAINE   SPECIMEN:  No Specimen  DISPOSITION OF SPECIMEN:  N/A  COUNTS:  YES  TOURNIQUET:  * No tourniquets in log *  DICTATION: Patient is 77 year old male with subluxation of C4 on 5 with stenosis, myelopathy, cord compression, HNP, DISH, s/p fracture at C 45 after a fall.  It was elected to take him to surgery for anterior cervical decompression and fusion C 45 level.  PROCEDURE: Patient was brought to operating room and following the smooth and uncomplicated induction of general endotracheal anesthesia his head was placed on a horseshoe head holder he was placed in 5 pounds of Holter traction and his anterior neck was prepped and draped in usual sterile fashion. An incision was made on the left side of midline after infiltrating the skin and subcutaneous tissues with local lidocaine. The platysmal layer was incised and subplatysmal dissection was performed exposing the anterior border sternocleidomastoid muscle. Using blunt dissection the carotid sheath was kept lateral and trachea and esophagus kept medial  exposing the anterior cervical spine. A bent spinal needle was placed it was felt to be the C 45 level, where there was bruising and disruption of anterior longitudinal ligament  and this was confirmed on intraoperative x-ray. Longus coli muscles were taken down from the anterior cervical spine using electrocautery and key elevator and self-retaining retractor was placed exposing the C 45 level. The interspace had been completely disrupted by the fracture.  Large flowing ventral osteophytes were removed and saved for later bone grafting.  The interspace was incised and a thorough discectomy was performed. Distraction pins were placed. Uncinate spurs and central spondylitic ridges were drilled down with a high-speed drill. The spinal cord dura and both C5 nerve roots were widely decompressed. Hemostasis was assured. After trial sizing a 7 mm lordotic PEEK cage was selected and packed with local autograft. This was tamped into position and countersunk appropriately. Distraction weight was removed. A 16 mm Aviator anterior cervical plate was affixed to the cervical spine with 16 mm fixed-angle screws 2 at C4, 2 at C5. All screws were well-positioned and locking mechanisms were engaged. A final X ray was obtained which showed well positioned graft and anterior plate without complicating features, although the C 4 screw on the right was initially projecting into the implanted cage, so a more cephalad trajectory was created and the screw was repositioned.  Final X ray showed well positioned implanted cage and plate.  Soft tissues were inspected and found to be in good repair. The wound was irrigated. The platysma layer was closed with 3-0 Vicryl stitches and the skin was reapproximated with 3-0 Vicryl subcuticular stitches. The wound was  dressed with Dermabond and an occlusive dressing. Counts were correct at the end of the case. Patient was extubated and taken to recovery in stable and satisfactory condition.  PLAN OF  CARE: Admit to inpatient   PATIENT DISPOSITION:  PACU - hemodynamically stable.   Delay start of Pharmacological VTE agent (>24hrs) due to surgical blood loss or risk of bleeding: yes

## 2018-08-31 NOTE — Social Work (Signed)
CSW acknowledging consult for SNF placement. Will follow for therapy recommendations.   Jaxan Michel, MSW, LCSWA [facility] [phone]   

## 2018-08-31 NOTE — Interval H&P Note (Signed)
History and Physical Interval Note:  08/31/2018 10:43 AM  Samuel Cortez  has presented today for surgery, with the diagnosis of spinal cord injury  The various methods of treatment have been discussed with the patient and family. After consideration of risks, benefits and other options for treatment, the patient has consented to  Procedure(s): ANTERIOR CERVICAL DECOMPRESSION/DISCECTOMY FUSION 1 LEVEL C4-C5 (N/A) as a surgical intervention .  The patient's history has been reviewed, patient examined, no change in status, stable for surgery.  I have reviewed the patient's chart and labs.  Questions were answered to the patient's satisfaction.     Peggyann Shoals

## 2018-08-31 NOTE — ED Notes (Signed)
Re-paged neurosurgery

## 2018-08-31 NOTE — Op Note (Signed)
08/31/2018  1:42 PM  PATIENT:  Terald Sleeper  77 y.o. male  PRE-OPERATIVE DIAGNOSIS:  spinal cord injury C 45 with spondylolisthesis, stenosis, myelopathy, herniated cervical disc and fracture, DISH  POST-OPERATIVE DIAGNOSIS: spinal cord injury C 45 with spondylolisthesis, stenosis, myelopathy, herniated cervical disc and fracture, DISH   PROCEDURE:  Procedure(s) with comments: ANTERIOR CERVICAL DECOMPRESSION/DISCECTOMY FUSION CERVICAL FOUR-CERVICAL FIVE (N/A) - ANTERIOR CERVICAL DECOMPRESSION/DISCECTOMY FUSION CERVICAL FOUR-CERVICAL FIVE with PEEK cage, autograft, anterior cervical plate  SURGEON:  Surgeon(s) and Role:    Erline Levine, MD - Primary    * Judith Part, MD - Assisting  PHYSICIAN ASSISTANT:   ASSISTANTS: Poteat, RN   ANESTHESIA:   general  EBL:  25 mL   BLOOD ADMINISTERED:none  DRAINS: none   LOCAL MEDICATIONS USED:  MARCAINE    and LIDOCAINE   SPECIMEN:  No Specimen  DISPOSITION OF SPECIMEN:  N/A  COUNTS:  YES  TOURNIQUET:  * No tourniquets in log *  DICTATION: Patient is 77 year old male with subluxation of C4 on 5 with stenosis, myelopathy, cord compression, HNP, DISH, s/p fracture at C 45 after a fall.  It was elected to take him to surgery for anterior cervical decompression and fusion C 45 level.  PROCEDURE: Patient was brought to operating room and following the smooth and uncomplicated induction of general endotracheal anesthesia his head was placed on a horseshoe head holder he was placed in 5 pounds of Holter traction and his anterior neck was prepped and draped in usual sterile fashion. An incision was made on the left side of midline after infiltrating the skin and subcutaneous tissues with local lidocaine. The platysmal layer was incised and subplatysmal dissection was performed exposing the anterior border sternocleidomastoid muscle. Using blunt dissection the carotid sheath was kept lateral and trachea and esophagus kept medial  exposing the anterior cervical spine. A bent spinal needle was placed it was felt to be the C 45 level, where there was bruising and disruption of anterior longitudinal ligament  and this was confirmed on intraoperative x-ray. Longus coli muscles were taken down from the anterior cervical spine using electrocautery and key elevator and self-retaining retractor was placed exposing the C 45 level. The interspace had been completely disrupted by the fracture.  Large flowing ventral osteophytes were removed and saved for later bone grafting.  The interspace was incised and a thorough discectomy was performed. Distraction pins were placed. Uncinate spurs and central spondylitic ridges were drilled down with a high-speed drill. The spinal cord dura and both C5 nerve roots were widely decompressed. Hemostasis was assured. After trial sizing a 7 mm lordotic PEEK cage was selected and packed with local autograft. This was tamped into position and countersunk appropriately. Distraction weight was removed. A 16 mm Aviator anterior cervical plate was affixed to the cervical spine with 16 mm fixed-angle screws 2 at C4, 2 at C5. All screws were well-positioned and locking mechanisms were engaged. A final X ray was obtained which showed well positioned graft and anterior plate without complicating features, although the C 4 screw on the right was initially projecting into the implanted cage, so a more cephalad trajectory was created and the screw was repositioned.  Final X ray showed well positioned implanted cage and plate.  Soft tissues were inspected and found to be in good repair. The wound was irrigated. The platysma layer was closed with 3-0 Vicryl stitches and the skin was reapproximated with 3-0 Vicryl subcuticular stitches. The wound was  dressed with Dermabond and an occlusive dressing. Counts were correct at the end of the case. Patient was extubated and taken to recovery in stable and satisfactory condition.  PLAN OF  CARE: Admit to inpatient   PATIENT DISPOSITION:  PACU - hemodynamically stable.   Delay start of Pharmacological VTE agent (>24hrs) due to surgical blood loss or risk of bleeding: yes

## 2018-08-31 NOTE — Progress Notes (Signed)
Awake, alert, conversant.  Still with right hand weakness.  Still c/o UE dysesthesias.  Otherwise, full strength in upper and lower extremities.  Patient is sore in neck.  Feels that numbness and arm pain is improved.

## 2018-08-31 NOTE — ED Notes (Signed)
Patient transported to MRI 

## 2018-08-31 NOTE — Anesthesia Preprocedure Evaluation (Addendum)
Anesthesia Evaluation  Patient identified by MRN, date of birth, ID band Patient awake    Reviewed: Allergy & Precautions, NPO status , Patient's Chart, lab work & pertinent test results  History of Anesthesia Complications Negative for: history of anesthetic complications  Airway Mallampati: II  TM Distance: >3 FB Neck ROM: Limited    Dental  (+) Edentulous Upper, Missing, Poor Dentition, Dental Advisory Given   Pulmonary neg pulmonary ROS, former smoker,    Pulmonary exam normal        Cardiovascular hypertension, Pt. on medications Normal cardiovascular exam     Neuro/Psych  C-spine not cleared negative psych ROS   GI/Hepatic negative GI ROS, Neg liver ROS,   Endo/Other  negative endocrine ROS  Renal/GU Renal disease (AKI vs. CKD)  negative genitourinary   Musculoskeletal  (+) Arthritis ,   Abdominal   Peds  Hematology negative hematology ROS (+)   Anesthesia Other Findings 77 yo M with spinal cord injury, for ACDF - HTN, AKI vs. CKD (Cr 1.5), BMI 31, gout  Reproductive/Obstetrics                            Anesthesia Physical Anesthesia Plan  ASA: III  Anesthesia Plan: General   Post-op Pain Management:    Induction: Intravenous  PONV Risk Score and Plan: 2 and Ondansetron, Dexamethasone and Treatment may vary due to age or medical condition  Airway Management Planned: Oral ETT and Video Laryngoscope Planned  Additional Equipment: None  Intra-op Plan:   Post-operative Plan: Extubation in OR  Informed Consent: I have reviewed the patients History and Physical, chart, labs and discussed the procedure including the risks, benefits and alternatives for the proposed anesthesia with the patient or authorized representative who has indicated his/her understanding and acceptance.   Dental advisory given  Plan Discussed with:   Anesthesia Plan Comments:         Anesthesia Quick Evaluation

## 2018-08-31 NOTE — H&P (Signed)
Reason for Consult:spinal cord injury Referring Physician: Cristhian Vanhook is an 77 y.o. male.  HPI: Patient fell this evening and hit back of his head on a magazine rack.  He had increasingly severe arm pain, right greater than left.  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  Past Medical History:  Diagnosis Date  . Gout   . Hypertension     Past Surgical History:  Procedure Laterality Date  . TONSILLECTOMY      No family history on file.  Social History:  reports that he has quit smoking. He has never used smokeless tobacco. He reports current alcohol use of about 2.0 standard drinks of alcohol per week. He reports that he does not use drugs.  Allergies:  Allergies  Allergen Reactions  . Pollen Extract Other (See Comments)    Lightheadedness, runny nose and itchy eyes (trees and shrubs, also)  . Adhesive [Tape] Rash    Medications: I have reviewed the patient's current medications.  Results for orders placed or performed during the hospital encounter of 08/30/18 (from the past 48 hour(s))  CBC with Differential     Status: Abnormal   Collection Time: 08/30/18  7:12 PM  Result Value Ref Range   WBC 11.0 (H) 4.0 - 10.5 K/uL   RBC 4.92 4.22 - 5.81 MIL/uL   Hemoglobin 14.5 13.0 - 17.0 g/dL   HCT 45.8 39.0 - 52.0 %   MCV 93.1 80.0 - 100.0 fL   MCH 29.5 26.0 - 34.0 pg   MCHC 31.7 30.0 - 36.0 g/dL   RDW 13.7 11.5 - 15.5 %   Platelets 319 150 - 400 K/uL   nRBC 0.0 0.0 - 0.2 %   Neutrophils Relative % 74 %   Neutro Abs 8.1 (H) 1.7 - 7.7 K/uL   Lymphocytes Relative 19 %   Lymphs Abs 2.1 0.7 - 4.0 K/uL   Monocytes Relative 6 %   Monocytes Absolute 0.7 0.1 - 1.0 K/uL   Eosinophils Relative 1 %   Eosinophils Absolute 0.1 0.0 - 0.5 K/uL   Basophils Relative 0 %   Basophils Absolute 0.0 0.0 - 0.1 K/uL   Immature Granulocytes 0 %   Abs Immature Granulocytes 0.03 0.00 - 0.07 K/uL    Comment: Performed at [facility], [address]  [place], [place] [address]  Basic metabolic panel     Status: Abnormal   Collection Time: 08/30/18  7:12 PM  Result Value Ref Range   Sodium 139 135 - 145 mmol/L   Potassium 4.0 3.5 - 5.1 mmol/L   Chloride 102 98 - 111 mmol/L   CO2 24 22 - 32 mmol/L   Glucose, Bld 134 (H) 70 - 99 mg/dL   BUN 22 8 - 23 mg/dL   Creatinine, Ser 1.51 (H) 0.61 - 1.24 mg/dL   Calcium 9.2 8.9 - 10.3 mg/dL   GFR calc non Af Amer 44 (L) >60 mL/min   GFR calc Af Amer 51 (L) >60 mL/min   Anion gap 13 5 - 15    Comment: Performed at [facility] [address], [place],  [address]    Dg Elbow Complete Left  Result Date: 08/30/2018 CLINICAL DATA:  Fall. Elbow pain. EXAM: LEFT ELBOW - COMPLETE 3+ VIEW COMPARISON:  None. FINDINGS: Mild osteoarthritic changes. No fracture or dislocation. Chronic tug changes at the olecranon related to the triceps tendon. IMPRESSION: No acute findings. Electronically Signed   By: Elta Guadeloupe  Shogry M.D.   On: 08/30/2018 20:49   Ct Cervical Spine Wo Contrast  Result Date: 08/30/2018 CLINICAL DATA:  Fall with upper extremity pain EXAM: CT CERVICAL SPINE WITHOUT CONTRAST TECHNIQUE: Multidetector CT imaging of the cervical spine was performed without intravenous contrast. Multiplanar CT image reconstructions were also generated. COMPARISON:  None. FINDINGS: Alignment: Straightening of the cervical spine. Grade 1 anterolisthesis C4 on C5. Facet alignment within normal limits. Skull base and vertebrae: Craniovertebral junction is intact. Possible small fracture involving the posterior, inferior right facet at C4. Soft tissues and spinal canal: No prevertebral fluid or swelling. No visible canal hematoma. Disc levels: Prominent partially calcified pannus posterior to C1-C2 articulation. Dish type changes of the cervicothoracic spine. Mild to moderate degenerative changes C5-C6, C6-C7 and C7-T1. Posterior facet degenerative changes. Upper chest: Lung apices are clear. 15 mm hypodense nodule in  the right lobe of the thyroid. Other: None IMPRESSION: 1. Small osseous fragments involving the right posterior facet at C4, may reflect small facet fracture fragments. Trace anterolisthesis of C4 on C5 at this level and ligamentous injury is considered. Correlation with MRI is suggested. 2. Findings consistent with DISH of the cervical spine and imaged upper thoracic spine. Electronically Signed   By: Donavan Foil M.D.   On: 08/30/2018 23:38   Mr Cervical Spine Wo Contrast  Result Date: 08/31/2018 CLINICAL DATA:  77 y/o M; fall with radiculopathy with severe pain in the right shoulder and left elbow. EXAM: MRI CERVICAL SPINE WITHOUT CONTRAST TECHNIQUE: Multiplanar, multisequence MR imaging of the cervical spine was performed. No intravenous contrast was administered. COMPARISON:  None. FINDINGS: Alignment: Straightening of cervical lordosis. C4-5 grade 1 anterolisthesis. Vertebrae: Mild edema within the anterior bridging osteophytes at C4-5 and surrounding prevertebral soft tissues compatible with osteophyte fracture. No propagation of the fracture into the intervertebral disc or vertebral bodies. No significant edema within the facets to indicate fracture. No additional findings for fracture or discitis. Diffuse flowing anterior ossification of the vertebral bodies compatible with DISH. Increased signal within the anterior C3-4 disc space without bone marrow edema, likely degenerative. Cord: Small focus of increased cord signal within right anterior cord at C4-5 level associated with a disc protrusion annular fissure (series 5, image 7). Posterior Fossa, vertebral arteries, paraspinal tissues: Left paramedian upper posterior neck 8 mm dermal cyst. 11 mm right adrenal cyst. Disc levels: C2-3: No significant disc displacement, foraminal stenosis, or canal stenosis. C3-4: No significant disc displacement, foraminal stenosis, or canal stenosis. C4-5: Central disc protrusion, endplate marginal osteophytes,  uncovertebral hypertrophy, and right-sided facet hypertrophy. Moderate bilateral foraminal stenosis and canal stenosis. C5-6: No significant disc displacement, foraminal stenosis, or canal stenosis. C6-7: No significant disc displacement, foraminal stenosis, or canal stenosis. C7-T1: No significant disc displacement, foraminal stenosis, or canal stenosis. IMPRESSION: 1. Mild edema within anterior C4-5 bridging osteophytes and prevertebral soft tissues compatible with osteophyte fracture. No vertebral body or facet fracture. 2. Grade 1 C4-5 anterolisthesis. 3. C4-5 disc protrusion with annular fissure impinging anterior cord. Mild C4-5 increased right anterior cord signal, likely representing mild cord contusion. Moderate C4-5 spinal canal stenosis. 4. DISH of visible cervical and thoracic spine. These results were called by telephone at the time of interpretation on 08/31/2018 at 1:57 am to [facility], who verbally acknowledged these results. Electronically Signed   By: Kristine Garbe M.D.   On: 08/31/2018 01:54   Dg Chest Port 1 View  Result Date: 08/30/2018 CLINICAL DATA:  Fall.  Shoulder pain. EXAM: PORTABLE CHEST 1 VIEW  COMPARISON:  None. FINDINGS: Artifact overlies the chest. Heart size is normal. There is aortic atherosclerosis. Lungs are clear. The vascularity is normal. No significant bone finding. IMPRESSION: No active disease. Electronically Signed   By: Nelson Chimes M.D.   On: 08/30/2018 20:48   Dg Shoulder Left Port  Result Date: 08/30/2018 CLINICAL DATA:  Fall from a stool. EXAM: LEFT SHOULDER - 1 VIEW COMPARISON:  None. FINDINGS: Mild-to-moderate degenerative spurring of the acromioclavicular joint. Glenoid spurring. Faint calcifications are present in the infraspinatus tendon region. No appreciable fracture or dislocation. Atherosclerotic calcification of the aortic arch. IMPRESSION: 1. Degenerative AC joint arthropathy. 2. Calcific tendinopathy of the infraspinatus tendon. 3.  Aortic  Atherosclerosis (ICD10-I70.0). 4. Degenerative spurring of the glenoid. 5. No fracture or dislocation identified. Electronically Signed   By: Van Clines M.D.   On: 08/30/2018 21:02   Dg Shoulder Right Portable  Result Date: 08/30/2018 CLINICAL DATA:  Fall. Shoulder pain. Assess for dislocation. EXAM: PORTABLE RIGHT SHOULDER COMPARISON:  None. FINDINGS: Humeral head is properly located. There are osteoarthritic changes of the glenohumeral joint. There is a prominent subacromial spur and there is AC joint degenerative change with inferiorly projecting spurs. These could certainly lead to rotator cuff impingement. No acute traumatic finding. IMPRESSION: No acute traumatic finding. Chronic degenerative changes as outlined above. Prominent subacromial spur a added ACE region spur could predispose to rotator cuff disease. Electronically Signed   By: Nelson Chimes M.D.   On: 08/30/2018 20:50    Review of Systems - Negative except Per HPI    Blood pressure 122/71, pulse 66, temperature 97.6 F (36.4 C), temperature source Oral, resp. rate 11, height 5\' 9"  (1.753 m), weight 95.3 kg, SpO2 93 %. Physical Exam  Constitutional: He is oriented to person, place, and time. He appears well-developed and well-nourished.  HENT:  Head: Normocephalic and atraumatic.  Eyes: Pupils are equal, round, and reactive to light. EOM are normal.  Neck:  In cervical collar  Cardiovascular: Normal rate and regular rhythm.  Respiratory: Effort normal and breath sounds normal.  GI: Soft. Bowel sounds are normal.  Neurological: He is alert and oriented to person, place, and time. He has normal reflexes. GCS eye subscore is 4. GCS verbal subscore is 5. GCS motor subscore is 6.  Deltoid 4+ right; hand intrinsics and grip 4/5 right.  Pain in both arms with dysesthesias in both hands    Assessment/Plan: Patient with fracture of C 45 with subluxation and stenosis and spinal cord compression with fusion of all other levels  of neck (he has fractured through C45 osteophyte).  I have recommended admission, cervical collar and ACDF C45 level.  Peggyann Shoals, MD 08/31/2018, 4:01 AM

## 2018-08-31 NOTE — Progress Notes (Signed)
Subjective: Patient reports "Just pain in my arms. The Fentanyl is helping some"  Objective: Vital signs in last 24 hours: Temp:  [97.6 F (36.4 C)] 97.6 F (36.4 C) (12/12 1841) Pulse Rate:  [49-76] 68 (12/13 0530) Resp:  [11-25] 16 (12/13 0530) BP: (80-156)/(48-91) 121/71 (12/13 0530) SpO2:  [92 %-100 %] 96 % (12/13 0530) Weight:  [95.3 kg] 95.3 kg (12/12 1843)  Intake/Output from previous day: No intake/output data recorded. Intake/Output this shift: No intake/output data recorded.  Alert, conversant. Reports BUE pain. Full strength BLE with no reported pain. Pain limits exam in upper extremities, Some weakness RUE and hand intrinsics right. He is aware of plan for ACDF this afternoon and wishes to proceed.  Lab Results: Recent Labs    08/30/18 1912  WBC 11.0*  HGB 14.5  HCT 45.8  PLT 319   BMET Recent Labs    08/30/18 1912  NA 139  K 4.0  CL 102  CO2 24  GLUCOSE 134*  BUN 22  CREATININE 1.51*  CALCIUM 9.2    Studies/Results: Dg Elbow Complete Left  Result Date: 08/30/2018 CLINICAL DATA:  Fall. Elbow pain. EXAM: LEFT ELBOW - COMPLETE 3+ VIEW COMPARISON:  None. FINDINGS: Mild osteoarthritic changes. No fracture or dislocation. Chronic tug changes at the olecranon related to the triceps tendon. IMPRESSION: No acute findings. Electronically Signed   By: Nelson Chimes M.D.   On: 08/30/2018 20:49   Ct Cervical Spine Wo Contrast  Result Date: 08/30/2018 CLINICAL DATA:  Fall with upper extremity pain EXAM: CT CERVICAL SPINE WITHOUT CONTRAST TECHNIQUE: Multidetector CT imaging of the cervical spine was performed without intravenous contrast. Multiplanar CT image reconstructions were also generated. COMPARISON:  None. FINDINGS: Alignment: Straightening of the cervical spine. Grade 1 anterolisthesis C4 on C5. Facet alignment within normal limits. Skull base and vertebrae: Craniovertebral junction is intact. Possible small fracture involving the posterior, inferior right  facet at C4. Soft tissues and spinal canal: No prevertebral fluid or swelling. No visible canal hematoma. Disc levels: Prominent partially calcified pannus posterior to C1-C2 articulation. Dish type changes of the cervicothoracic spine. Mild to moderate degenerative changes C5-C6, C6-C7 and C7-T1. Posterior facet degenerative changes. Upper chest: Lung apices are clear. 15 mm hypodense nodule in the right lobe of the thyroid. Other: None IMPRESSION: 1. Small osseous fragments involving the right posterior facet at C4, may reflect small facet fracture fragments. Trace anterolisthesis of C4 on C5 at this level and ligamentous injury is considered. Correlation with MRI is suggested. 2. Findings consistent with DISH of the cervical spine and imaged upper thoracic spine. Electronically Signed   By: Donavan Foil M.D.   On: 08/30/2018 23:38   Mr Cervical Spine Wo Contrast  Result Date: 08/31/2018 CLINICAL DATA:  77 y/o M; fall with radiculopathy with severe pain in the right shoulder and left elbow. EXAM: MRI CERVICAL SPINE WITHOUT CONTRAST TECHNIQUE: Multiplanar, multisequence MR imaging of the cervical spine was performed. No intravenous contrast was administered. COMPARISON:  None. FINDINGS: Alignment: Straightening of cervical lordosis. C4-5 grade 1 anterolisthesis. Vertebrae: Mild edema within the anterior bridging osteophytes at C4-5 and surrounding prevertebral soft tissues compatible with osteophyte fracture. No propagation of the fracture into the intervertebral disc or vertebral bodies. No significant edema within the facets to indicate fracture. No additional findings for fracture or discitis. Diffuse flowing anterior ossification of the vertebral bodies compatible with DISH. Increased signal within the anterior C3-4 disc space without bone marrow edema, likely degenerative. Cord: Small focus of increased  cord signal within right anterior cord at C4-5 level associated with a disc protrusion annular fissure  (series 5, image 7). Posterior Fossa, vertebral arteries, paraspinal tissues: Left paramedian upper posterior neck 8 mm dermal cyst. 11 mm right adrenal cyst. Disc levels: C2-3: No significant disc displacement, foraminal stenosis, or canal stenosis. C3-4: No significant disc displacement, foraminal stenosis, or canal stenosis. C4-5: Central disc protrusion, endplate marginal osteophytes, uncovertebral hypertrophy, and right-sided facet hypertrophy. Moderate bilateral foraminal stenosis and canal stenosis. C5-6: No significant disc displacement, foraminal stenosis, or canal stenosis. C6-7: No significant disc displacement, foraminal stenosis, or canal stenosis. C7-T1: No significant disc displacement, foraminal stenosis, or canal stenosis. IMPRESSION: 1. Mild edema within anterior C4-5 bridging osteophytes and prevertebral soft tissues compatible with osteophyte fracture. No vertebral body or facet fracture. 2. Grade 1 C4-5 anterolisthesis. 3. C4-5 disc protrusion with annular fissure impinging anterior cord. Mild C4-5 increased right anterior cord signal, likely representing mild cord contusion. Moderate C4-5 spinal canal stenosis. 4. DISH of visible cervical and thoracic spine. These results were called by telephone at the time of interpretation on 08/31/2018 at 1:57 am to [facility] who verbally acknowledged these results. Electronically Signed   By: Kristine Garbe M.D.   On: 08/31/2018 01:54   Dg Chest Port 1 View  Result Date: 08/30/2018 CLINICAL DATA:  Fall.  Shoulder pain. EXAM: PORTABLE CHEST 1 VIEW COMPARISON:  None. FINDINGS: Artifact overlies the chest. Heart size is normal. There is aortic atherosclerosis. Lungs are clear. The vascularity is normal. No significant bone finding. IMPRESSION: No active disease. Electronically Signed   By: Nelson Chimes M.D.   On: 08/30/2018 20:48   Dg Shoulder Left Port  Result Date: 08/30/2018 CLINICAL DATA:  Fall from a stool. EXAM: LEFT SHOULDER - 1 VIEW  COMPARISON:  None. FINDINGS: Mild-to-moderate degenerative spurring of the acromioclavicular joint. Glenoid spurring. Faint calcifications are present in the infraspinatus tendon region. No appreciable fracture or dislocation. Atherosclerotic calcification of the aortic arch. IMPRESSION: 1. Degenerative AC joint arthropathy. 2. Calcific tendinopathy of the infraspinatus tendon. 3.  Aortic Atherosclerosis (ICD10-I70.0). 4. Degenerative spurring of the glenoid. 5. No fracture or dislocation identified. Electronically Signed   By: Van Clines M.D.   On: 08/30/2018 21:02   Dg Shoulder Right Portable  Result Date: 08/30/2018 CLINICAL DATA:  Fall. Shoulder pain. Assess for dislocation. EXAM: PORTABLE RIGHT SHOULDER COMPARISON:  None. FINDINGS: Humeral head is properly located. There are osteoarthritic changes of the glenohumeral joint. There is a prominent subacromial spur and there is AC joint degenerative change with inferiorly projecting spurs. These could certainly lead to rotator cuff impingement. No acute traumatic finding. IMPRESSION: No acute traumatic finding. Chronic degenerative changes as outlined above. Prominent subacromial spur a added ACE region spur could predispose to rotator cuff disease. Electronically Signed   By: Nelson Chimes M.D.   On: 08/30/2018 20:50    Assessment/Plan:   LOS: 0 days  ACDF C4-5 today. NPO, [place] Collar.    Verdis Prime 08/31/2018, 7:49 AM

## 2018-09-01 MED ORDER — METHOCARBAMOL 500 MG PO TABS: 500.0000 mg | ORAL_TABLET | Freq: Four times a day (QID) | ORAL | 1 refills | Status: DC | PRN

## 2018-09-01 MED ORDER — OXYCODONE HCL 5 MG PO TABS: 5.0000 mg | ORAL_TABLET | ORAL | 0 refills | Status: DC | PRN

## 2018-09-01 NOTE — Evaluation (Signed)
Occupational Therapy Evaluation Patient Details Name: Samuel Cortez MRN: [id number] DOB: 08/15/1941 Today's Date: 09/01/2018    History of Present Illness Patient is a 77 yo male with fracture of C 45 with subluxation and stenosis and spinal cord compression with fusion of all other levels of neck now s/p ANTERIOR CERVICAL DECOMPRESSION/DISCECTOMY FUSION 1 LEVEL C4-C5   Clinical Impression   Pt admitted with above. He demonstrates the below listed deficits and will benefit from continued OT to maximize safety and independence with BADLs. Pt presents to OT with Rt UE weakness, pain, decreased activity tolerance, impaired coordination.  He is able to perform ADLs with min guard assist.  PTA, pt was able to perform ADLs mod I.   He lives with wife who is very supportive and able to assist as needed.  Recommend HHOT.       Follow Up Recommendations  Home health OT;Supervision/Assistance - 24 hour    Equipment Recommendations  None recommended by OT    Recommendations for Other Services       Precautions / Restrictions Precautions Precautions: Cervical Precaution Booklet Issued: Yes (comment) Precaution Comments: verbally reviewed Required Braces or Orthoses: Cervical Brace Cervical Brace: Hard collar Restrictions Weight Bearing Restrictions: No      Mobility Bed Mobility               General bed mobility comments: Pt sitting up in chair   Transfers Overall transfer level: Needs assistance Equipment used: None Transfers: Sit to/from Stand Sit to Stand: Min guard         General transfer comment: min guard for safety     Balance Overall balance assessment: Mild deficits observed, not formally tested                                         ADL either performed or assessed with clinical judgement   ADL Overall ADL's : Needs assistance/impaired Eating/Feeding: Modified independent;Sitting   Grooming: Wash/dry hands;Wash/dry face;Oral  care;Brushing hair;Min guard;Standing Grooming Details (indicate cue type and reason): instructed pt on safe technique for shaving and brushing teeth  Upper Body Bathing: Supervision/ safety;Sitting   Lower Body Bathing: Min guard;Sit to/from stand   Upper Body Dressing : Supervision/safety;Sitting   Lower Body Dressing: Sit to/from stand;Min guard Lower Body Dressing Details (indicate cue type and reason): Pt able to cross ankles over knees  Toilet Transfer: Min guard;Ambulation;Comfort height toilet;RW   Toileting- Water quality scientist and Hygiene: Min guard;Sit to/from stand       Functional mobility during ADLs: Min guard;Rolling walker General ADL Comments: reviewed how to don/doff collar with pt,  as well as how to switch out pads.  Pt does not have spare set of pads.  contacted [employer], who reports they may be able to get pt a second set of pads - RN notified      Vision         Perception     Praxis      Pertinent Vitals/Pain Pain Assessment: 0-10 Pain Score: 7  Pain Location: right mid to upper arm Pain Descriptors / Indicators: Burning;Sore Pain Intervention(s): Monitored during session     Hand Dominance Right   Extremity/Trunk Assessment Upper Extremity Assessment Upper Extremity Assessment: RUE deficits/detail;LUE deficits/detail RUE Deficits / Details: hand 3-/5; wrist 3-/5, elbow and shoulder grossly 3/5.   Pt with parasthesias and reports burning bil. forearms  LUE Deficits /  Details: grossly 4+/5   Lower Extremity Assessment Lower Extremity Assessment: Defer to PT evaluation   Cervical / Trunk Assessment Cervical / Trunk Assessment: (s/p cervical sx)   Communication Communication Communication: HOH   Cognition Arousal/Alertness: Awake/alert Behavior During Therapy: WFL for tasks assessed/performed                                   General Comments: Pt noted to ask repeated questions at times    General Comments  reviewed safety  with ADLs as well as cervical precautions.  Pt instructed to clarify with MD if he can remove collar for shower     Exercises     Shoulder Instructions      Home Living Family/patient expects to be discharged to:: Private residence Living Arrangements: Spouse/significant other Available Help at Discharge: Family;Available 24 hours/day Type of Home: House Home Access: Stairs to enter [employer] of Steps: 2 Entrance Stairs-Rails: None Home Layout: Two level;Able to live on main level with bedroom/bathroom     Bathroom Shower/Tub: Occupational psychologist: Handicapped height Bathroom Accessibility: Yes   Home Equipment: Environmental consultant - 2 wheels;Cane - single point;Shower seat   Additional Comments: wife very supportive       Prior Functioning/Environment Level of Independence: Independent        Comments: ocassional use of a cane when hip bothers him        OT Problem List: Decreased strength;Decreased range of motion;Decreased activity tolerance;Decreased coordination;Decreased knowledge of use of DME or AE;Decreased knowledge of precautions;Impaired sensation;Impaired UE functional use;Pain      OT Treatment/Interventions:      OT Goals(Current goals can be found in the care plan section) Acute Rehab OT Goals Patient Stated Goal: to go home today! OT Goal Formulation: With patient Time For Goal Achievement: 09/08/18 Potential to Achieve Goals: Good ADL Goals Pt/caregiver will Perform Home Exercise Program: Increased strength;Increased ROM;Independently;With written HEP provided  OT Frequency:     Barriers to D/C:            Co-evaluation              AM-PAC OT "6 Clicks" Daily Activity     Outcome Measure Help from another person eating meals?: None Help from another person taking care of personal grooming?: A Little Help from another person toileting, which includes using toliet, bedpan, or urinal?: A Little Help from another person  bathing (including washing, rinsing, drying)?: A Little Help from another person to put on and taking off regular upper body clothing?: A Little Help from another person to put on and taking off regular lower body clothing?: A Little 6 Click Score: 19   End of Session Equipment Utilized During Treatment: Cervical collar Nurse Communication: Mobility status;Other (comment)(need for second set of pads )  Activity Tolerance: Patient tolerated treatment well Patient left: in chair;with call bell/phone within reach  OT Visit Diagnosis: Pain;Muscle weakness (generalized) (M62.81) Pain - Right/Left: Right Pain - part of body: Arm                Time: 6629-4765 OT Time Calculation (min): 21 min Charges:  OT General Charges $OT Visit: 1 Visit OT Evaluation $OT Eval Moderate Complexity: 1 Mod  Lucille Passy, OTR/L Acute Rehabilitation Services Pager [phone] Office [phone]   Lucille Passy M 09/01/2018, 3:00 PM

## 2018-09-01 NOTE — Progress Notes (Signed)
Occupational Therapy Note  OT eval completed.  Recommend follow up [place] at discharge.  He does not need any DME.  Full note to follow.  Lucille Passy, OTR/L [employer]ner, broadcasting[employer]film[employer]video [phone] Office [phone]

## 2018-09-01 NOTE — Progress Notes (Signed)
Progress Note  S: No new complaints  O: Pt awake/alert, neurologically at baseline, appropriate post-operative soreness.  A/P: 77yo man s/p C4-5 ACDF for C4-5 fracture in the setting of DISH. Recovering well, okay for discharge home today with home PT/OT.  Judith Part, MD

## 2018-09-01 NOTE — Progress Notes (Signed)
Spoke with Case Management, Physician needs to perform a face to face for home health PT and OT.   Called Physician practice and left a message.

## 2018-09-01 NOTE — Care Management Note (Signed)
Case Management Note  Patient Details  Name: DAVAN NAWABI MRN: [id number] Date of Birth: 27-Nov-1940  Subjective/Objective:  77 yr old gentleman s/p C1-C4 Anterior Cervical Decompression and Fusion.                   Action/Plan: Case manager spoke with patient's wife (patient sleeping) concerning discharge plan. Choice for [place] offered, referral called to Melene Muller, [facility] Liaison. Patient will have family support at discharge.    Expected Discharge Date:  09/01/18               Expected Discharge Plan:  [place]  In-House Referral:  NA  Discharge planning Services  CM Consult  Post Acute Care Choice:  Home Health Choice offered to:  Spouse  DME Arranged:  N/A(Has DME) DME Agency:  NA  HH Arranged:  OT [place] Agency:  [place]  Status of Service:  Completed, signed off  If discussed at [place] of Stay Meetings, dates discussed:    Additional Comments:  Ninfa Meeker, RN 09/01/2018, 12:58 PM

## 2018-09-01 NOTE — Progress Notes (Signed)
Dr. Zada Finders returned call and was update about the need for face to face for home health PT and OT.

## 2018-09-01 NOTE — Discharge Summary (Signed)
Physician Discharge Summary  Patient ID: Samuel Cortez MRN: [id number] DOB/AGE: 77-Jul-1942 77 y.o.  Admit date: 08/30/2018 Discharge date: 09/01/2018  Admission Diagnoses:  Discharge Diagnoses:  Active Problems:   Spinal cord injury at C1-C4 level [facility])   Discharged Condition: good  Hospital Course: Patient admitted to the hospital for treatment of severe cervical stenosis with spinal cord injury.  Patient underwent anterior cervical decompression and fusion without difficulty.  Postoperatively progressing well.  Neck pain, upper extremity pain paresthesias and dysesthesias all much improved.  Strength and sensation also much improved.  Patient ambulating well.  Ready for discharge home.  Consults:   Significant Diagnostic Studies:   Treatments:   Discharge Exam: Blood pressure 137/68, pulse 67, temperature 98 F (36.7 C), temperature source Oral, resp. rate 17, height 5' 9.02" (1.753 m), weight 95.3 kg, SpO2 96 %. Awake and alert.  Oriented and appropriate.  Cranial nerve function intact.  Motor examination of the extremities with some mild grip and intrinsic weakness in both hands otherwise intact.  Lower extremity strength mildly spastic but good strength otherwise.  Wound clean and dry.  Chest and abdomen benign.  Disposition: Discharge disposition: 01-Home or Self Care        Allergies as of 09/01/2018      Reactions   Pollen Extract Other (See Comments)   Lightheadedness, runny nose and itchy eyes (trees and shrubs, also)   Adhesive [tape] Rash      Medication List    TAKE these medications   acetaminophen 500 MG tablet Commonly known as:  TYLENOL Take 500-1,000 mg by mouth every 6 (six) hours as needed (for pain).   allopurinol 300 MG tablet Commonly known as:  ZYLOPRIM Take 300 mg by mouth daily.   amLODipine 2.5 MG tablet Commonly known as:  NORVASC Take 2.5 mg by mouth daily.   LUMIGAN 0.01 % Soln Generic drug:  bimatoprost Place 1 drop into  both eyes at bedtime.   methocarbamol 500 MG tablet Commonly known as:  ROBAXIN Take 1 tablet (500 mg total) by mouth every 6 (six) hours as needed for muscle spasms.   oxyCODONE 5 MG immediate release tablet Commonly known as:  Oxy IR/ROXICODONE Take 1 tablet (5 mg total) by mouth every 3 (three) hours as needed for moderate pain ((score 4 to 6)).   SYSTANE BALANCE 0.6 % Soln Generic drug:  Propylene Glycol Place 2 drops into both eyes as needed (for itching).   telmisartan-hydrochlorothiazide 40-12.5 MG tablet Commonly known as:  MICARDIS HCT Take 1 tablet by mouth daily.        Signed: Cooper Render Lynanne Delgreco 09/01/2018, 11:11 AM

## 2018-09-01 NOTE — Progress Notes (Signed)
Occupational Therapy Progress note  Pt was provided with tan theraputty and was instructed in HEP for Rt hand.  He was provided with handout and was able to return demonstration.  He was also instructed in desensitization exercises for paresthesias bil. UEs.  Wife instructed how to assist pt with donning/doffing, and adjusting collar as well as how to change out the pads.  He is eager for discharge.   Recommend HHOT    09/01/18 1500  OT Visit Information  Last OT Received On 09/01/18  Assistance Needed +1  History of Present Illness Patient is a 77 yo male with fracture of C 45 with subluxation and stenosis and spinal cord compression with fusion of all other levels of neck now s/p ANTERIOR CERVICAL DECOMPRESSION/DISCECTOMY FUSION 1 LEVEL C4-C5  Precautions  Precautions Cervical  Precaution Booklet Issued Yes (comment)  Precaution Comments verbally reviewed  Required Braces or Orthoses Cervical Brace  Cervical Brace Hard collar  Pain Assessment  Pain Assessment 0-10  Pain Score 5  Pain Location right mid to upper arm  Pain Descriptors / Indicators Burning;Sore  Pain Intervention(s) Monitored during session;Other (comment) (instructed pt in desensitization )  Cognition  Arousal/Alertness Awake/alert  Behavior During Therapy WFL for tasks assessed/performed  General Comments Pt noted to ask repeated questions at times   Upper Extremity Assessment  Upper Extremity Assessment RUE deficits/detail  RUE Deficits / Details hand 3-/5; wrist 3-/5, elbow and shoulder grossly 3/5.   Pt with parasthesias and reports burning bil. forearms   LUE Deficits / Details grossly 4+/5  Lower Extremity Assessment  Lower Extremity Assessment Defer to PT evaluation  ADL  General ADL Comments Pt and wife were instructed that pt should sit to shower initially.  Wife instructed how to assist pt with donning/doffing collar and how to change out pads   Bed Mobility  General bed mobility comments Pt sitting up  in chair   Exercises  Exercises Other exercises  Other Exercises  Other Exercises Pt provided with foam built up handles to use with utensils and toothbrush.     Other Exercises Pt provided with theraputty exercises for Rt hand and tan theraputty.  [employer] [id number].   He was instructed how to perform exercises and returned demonstration.   Other Exercises Pt instructed in desensitization exercises for parasthesias bil. UEs.  He returned demonstration   OT - End of Session  Equipment Utilized During Treatment Cervical collar  Activity Tolerance Patient tolerated treatment well  Patient left in chair;with call bell/phone within reach;with family/visitor present  Nurse Communication Mobility status  OT Assessment/Plan  OT Plan Discharge plan remains appropriate  OT Visit Diagnosis Pain;Muscle weakness (generalized) (M62.81)  Pain - Right/Left Right  Pain - part of body Arm  Follow Up Recommendations Home health OT;Supervision/Assistance - 24 hour  OT Equipment None recommended by OT  AM-PAC OT "6 Clicks" Daily Activity Outcome Measure (Version 2)  Help from another person eating meals? 4  Help from another person taking care of personal grooming? 3  Help from another person toileting, which includes using toliet, bedpan, or urinal? 3  Help from another person bathing (including washing, rinsing, drying)? 3  Help from another person to put on and taking off regular upper body clothing? 3  Help from another person to put on and taking off regular lower body clothing? 3  6 Click Score 19  OT Goal Progression  Progress towards OT goals Progressing toward goals  OT Time Calculation  OT Start Time (ACUTE ONLY)  1106  OT Stop Time (ACUTE ONLY) 1128  OT Time Calculation (min) 22 min  OT General Charges  $OT Visit 1 Visit  OT Treatments  $Therapeutic Exercise 8-22 mins  Lucille Passy, OTR/L Acute Rehabilitation Services Pager [phone] Office [phone]

## 2018-09-01 NOTE — Evaluation (Signed)
Physical Therapy Evaluation Patient Details Name: Samuel Cortez MRN: [id number] DOB: [birth year] Today's Date: 09/01/2018   History of Present Illness  Patient is a 77 yo male with fracture of C 45 with subluxation and stenosis and spinal cord compression with fusion of all other levels of neck now s/p ANTERIOR CERVICAL DECOMPRESSION/DISCECTOMY FUSION 1 LEVEL C4-C5  Clinical Impression  Orders received for PT evaluation. Patient demonstrates deficits in functional mobility as indicated below. Will benefit from continued skilled PT to address deficits and maximize function. Will see as indicated and progress as tolerated.  Patient extremely set on going home for his anniversary, therefore will recommend HHPT upon acute discharge to further address therapy needs.     Follow Up Recommendations Home health PT;Supervision for mobility/OOB    Equipment Recommendations  None recommended by PT(patient has RW at home)    Recommendations for Other Services       Precautions / Restrictions Precautions Precautions: Cervical Precaution Booklet Issued: Yes (comment) Precaution Comments: verbally reviewed Required Braces or Orthoses: Cervical Brace Cervical Brace: Hard collar Restrictions Weight Bearing Restrictions: No      Mobility  Bed Mobility               General bed mobility comments: received in recliner  Transfers Overall transfer level: Needs assistance Equipment used: None Transfers: Sit to/from Stand Sit to Stand: Min guard         General transfer comment: Min guard for safety and stability upon elevation to upright  Ambulation/Gait Ambulation/Gait assistance: Min guard Gait Distance (Feet): 310 Feet Assistive device: Rolling walker (2 wheeled) Gait Pattern/deviations: Step-through pattern;Decreased stride length;Trunk flexed;Drifts right/left Gait velocity: decreased Gait velocity interpretation: <1.8 ft/sec, indicate of risk for recurrent falls General  Gait Details: ocassional moments of instability during ambulation, Vcs for positioning within RW. Min guard for safety and stability  Stairs Stairs: Yes Stairs assistance: Min assist Stair Management: One rail Right Number of Stairs: 3 General stair comments: assist for stability during neogtiation, educated to utilize LUE for rails or assist due to RUE weakness  Wheelchair Mobility    Modified Rankin (Stroke Patients Only)       Balance Overall balance assessment: Mild deficits observed, not formally tested         Standing balance support: During functional activity Standing balance-Leahy Scale: Fair Standing balance comment: able to release UE support in static standing                             Pertinent Vitals/Pain Pain Assessment: 0-10 Pain Score: 3  Pain Location: right mid to upper arm Pain Descriptors / Indicators: Burning;Sore Pain Intervention(s): Monitored during session    Home Living Family/patient expects to be discharged to:: Private residence Living Arrangements: Spouse/significant other Available Help at Discharge: Family;Available 24 hours/day Type of Home: House Home Access: Stairs to enter Entrance Stairs-Rails: None Entrance Stairs-Number of Steps: 2 Home Layout: Two level;Able to live on main level with bedroom/bathroom Home Equipment: Gilford Rile - 2 wheels;Cane - single point;Shower seat      Prior Function Level of Independence: Independent         Comments: ocassional use of a cane when hip bothers him     Hand Dominance   Dominant Hand: Right    Extremity/Trunk Assessment   Upper Extremity Assessment Upper Extremity Assessment: Defer to OT evaluation    Lower Extremity Assessment Lower Extremity Assessment: Overall WFL for tasks assessed  Cervical / Trunk Assessment Cervical / Trunk Assessment: (s/p cervical sx)  Communication   Communication: HOH  Cognition Arousal/Alertness: Awake/alert Behavior During  Therapy: WFL for tasks assessed/performed Overall Cognitive Status: No family/caregiver present to determine baseline cognitive functioning                                        General Comments      Exercises     Assessment/Plan    PT Assessment Patient needs continued PT services  PT Problem List Decreased strength;Decreased activity tolerance;Decreased balance;Decreased mobility;Decreased cognition;Decreased safety awareness;Decreased knowledge of precautions;Pain       PT Treatment Interventions DME instruction;Gait training;Stair training;Functional mobility training;Therapeutic activities;Therapeutic exercise;Balance training;Patient/family education;Neuromuscular re-education    PT Goals (Current goals can be found in the Care Plan section)  Acute Rehab PT Goals Patient Stated Goal: to go home to be with his wife for anniversary PT Goal Formulation: With patient Time For Goal Achievement: 09/15/18 Potential to Achieve Goals: Good    Frequency Min 5X/week   Barriers to discharge   .    Co-evaluation               AM-PAC PT "6 Clicks" Mobility  Outcome Measure Help needed turning from your back to your side while in a flat bed without using bedrails?: A Little Help needed moving from lying on your back to sitting on the side of a flat bed without using bedrails?: A Little Help needed moving to and from a bed to a chair (including a wheelchair)?: A Little Help needed standing up from a chair using your arms (e.g., wheelchair or bedside chair)?: A Little Help needed to walk in hospital room?: A Little Help needed climbing 3-5 steps with a railing? : A Little 6 Click Score: 18    End of Session Equipment Utilized During Treatment: Cervical collar Activity Tolerance: Patient tolerated treatment well Patient left: in chair;with call bell/phone within reach Nurse Communication: Mobility status PT Visit Diagnosis: Unsteadiness on feet  (R26.81);Muscle weakness (generalized) (M62.81)    Time: 8206-0156 PT Time Calculation (min) (ACUTE ONLY): 20 min   Charges:   PT Evaluation $PT Eval Moderate Complexity: 1 Mod          Alben Deeds, PT DPT  Board Certified Neurologic Specialist Acute Rehabilitation Services Pager [phone] Office [phone]   Duncan Dull 09/01/2018, 10:11 AM

## 2018-09-03 ENCOUNTER — Encounter (HOSPITAL_COMMUNITY): Payer: Self-pay | Admitting: Neurosurgery

## 2018-09-03 DIAGNOSIS — S12401D Unspecified nondisplaced fracture of fifth cervical vertebra, subsequent encounter for fracture with routine healing: Secondary | ICD-10-CM | POA: Diagnosis not present

## 2018-09-03 DIAGNOSIS — S12301D Unspecified nondisplaced fracture of fourth cervical vertebra, subsequent encounter for fracture with routine healing: Secondary | ICD-10-CM | POA: Diagnosis not present

## 2018-09-03 DIAGNOSIS — I10 Essential (primary) hypertension: Secondary | ICD-10-CM | POA: Diagnosis not present

## 2018-09-03 DIAGNOSIS — M4812 Ankylosing hyperostosis [Forestier], cervical region: Secondary | ICD-10-CM | POA: Diagnosis not present

## 2018-09-03 DIAGNOSIS — S14104D Unspecified injury at C4 level of cervical spinal cord, subsequent encounter: Secondary | ICD-10-CM | POA: Diagnosis not present

## 2018-09-03 DIAGNOSIS — S13150D Subluxation of C4/C5 cervical vertebrae, subsequent encounter: Secondary | ICD-10-CM | POA: Diagnosis not present

## 2018-09-03 DIAGNOSIS — M4312 Spondylolisthesis, cervical region: Secondary | ICD-10-CM | POA: Diagnosis not present

## 2018-09-03 DIAGNOSIS — W08XXXD Fall from other furniture, subsequent encounter: Secondary | ICD-10-CM | POA: Diagnosis not present

## 2018-09-03 DIAGNOSIS — Z87891 Personal history of nicotine dependence: Secondary | ICD-10-CM | POA: Diagnosis not present

## 2018-09-04 NOTE — Consult Note (Signed)
            [facility] CM Primary Care Navigator  09/04/2018  Samuel Cortez 11/09/1940 [id number]   Attempt to seepatient at the bedside to identify possible discharge needs buthewasalready discharged over the weekend. Patient went home with home health services per therapy recommendation.  Per MD note,patient was admitted to the hospital for treatment of severe cervical stenosis with spinal cord injury at C1- C4 level. Patient underwent anterior cervical decompression and fusion without difficulty.  Primary care provider's office is listed as providing transition of care (TOC) follow-up.   For additional questions please contact:  Edwena Felty A. Lysha Schrade, BSN, RN-BC [facility] PRIMARY CARE Navigator Cell: [phone]

## 2018-09-06 DIAGNOSIS — M4312 Spondylolisthesis, cervical region: Secondary | ICD-10-CM | POA: Diagnosis not present

## 2018-09-06 DIAGNOSIS — Z981 Arthrodesis status: Secondary | ICD-10-CM | POA: Diagnosis not present

## 2018-09-06 DIAGNOSIS — S12401D Unspecified nondisplaced fracture of fifth cervical vertebra, subsequent encounter for fracture with routine healing: Secondary | ICD-10-CM | POA: Diagnosis not present

## 2018-09-06 DIAGNOSIS — S12301D Unspecified nondisplaced fracture of fourth cervical vertebra, subsequent encounter for fracture with routine healing: Secondary | ICD-10-CM | POA: Diagnosis not present

## 2018-09-06 DIAGNOSIS — R0982 Postnasal drip: Secondary | ICD-10-CM | POA: Diagnosis not present

## 2018-09-06 DIAGNOSIS — I10 Essential (primary) hypertension: Secondary | ICD-10-CM | POA: Diagnosis not present

## 2018-09-06 DIAGNOSIS — S14104D Unspecified injury at C4 level of cervical spinal cord, subsequent encounter: Secondary | ICD-10-CM | POA: Diagnosis not present

## 2018-09-06 DIAGNOSIS — M4812 Ankylosing hyperostosis [Forestier], cervical region: Secondary | ICD-10-CM | POA: Diagnosis not present

## 2018-09-06 DIAGNOSIS — R350 Frequency of micturition: Secondary | ICD-10-CM | POA: Diagnosis not present

## 2018-09-06 DIAGNOSIS — S13150D Subluxation of C4/C5 cervical vertebrae, subsequent encounter: Secondary | ICD-10-CM | POA: Diagnosis not present

## 2018-09-06 DIAGNOSIS — N183 Chronic kidney disease, stage 3 (moderate): Secondary | ICD-10-CM | POA: Diagnosis not present

## 2018-09-06 DIAGNOSIS — Z6829 Body mass index (BMI) 29.0-29.9, adult: Secondary | ICD-10-CM | POA: Diagnosis not present

## 2018-09-06 DIAGNOSIS — H9193 Unspecified hearing loss, bilateral: Secondary | ICD-10-CM | POA: Diagnosis not present

## 2018-09-10 DIAGNOSIS — S14104D Unspecified injury at C4 level of cervical spinal cord, subsequent encounter: Secondary | ICD-10-CM | POA: Diagnosis not present

## 2018-09-10 DIAGNOSIS — S13150D Subluxation of C4/C5 cervical vertebrae, subsequent encounter: Secondary | ICD-10-CM | POA: Diagnosis not present

## 2018-09-10 DIAGNOSIS — S12301D Unspecified nondisplaced fracture of fourth cervical vertebra, subsequent encounter for fracture with routine healing: Secondary | ICD-10-CM | POA: Diagnosis not present

## 2018-09-10 DIAGNOSIS — S12401D Unspecified nondisplaced fracture of fifth cervical vertebra, subsequent encounter for fracture with routine healing: Secondary | ICD-10-CM | POA: Diagnosis not present

## 2018-09-10 DIAGNOSIS — M4312 Spondylolisthesis, cervical region: Secondary | ICD-10-CM | POA: Diagnosis not present

## 2018-09-10 DIAGNOSIS — M4812 Ankylosing hyperostosis [Forestier], cervical region: Secondary | ICD-10-CM | POA: Diagnosis not present

## 2018-09-11 DIAGNOSIS — M4812 Ankylosing hyperostosis [Forestier], cervical region: Secondary | ICD-10-CM | POA: Diagnosis not present

## 2018-09-11 DIAGNOSIS — S12301D Unspecified nondisplaced fracture of fourth cervical vertebra, subsequent encounter for fracture with routine healing: Secondary | ICD-10-CM | POA: Diagnosis not present

## 2018-09-11 DIAGNOSIS — M4312 Spondylolisthesis, cervical region: Secondary | ICD-10-CM | POA: Diagnosis not present

## 2018-09-11 DIAGNOSIS — S13150D Subluxation of C4/C5 cervical vertebrae, subsequent encounter: Secondary | ICD-10-CM | POA: Diagnosis not present

## 2018-09-11 DIAGNOSIS — S14104D Unspecified injury at C4 level of cervical spinal cord, subsequent encounter: Secondary | ICD-10-CM | POA: Diagnosis not present

## 2018-09-11 DIAGNOSIS — S12401D Unspecified nondisplaced fracture of fifth cervical vertebra, subsequent encounter for fracture with routine healing: Secondary | ICD-10-CM | POA: Diagnosis not present

## 2018-09-13 DIAGNOSIS — M4812 Ankylosing hyperostosis [Forestier], cervical region: Secondary | ICD-10-CM | POA: Diagnosis not present

## 2018-09-13 DIAGNOSIS — S13150D Subluxation of C4/C5 cervical vertebrae, subsequent encounter: Secondary | ICD-10-CM | POA: Diagnosis not present

## 2018-09-13 DIAGNOSIS — S14104D Unspecified injury at C4 level of cervical spinal cord, subsequent encounter: Secondary | ICD-10-CM | POA: Diagnosis not present

## 2018-09-13 DIAGNOSIS — S12401D Unspecified nondisplaced fracture of fifth cervical vertebra, subsequent encounter for fracture with routine healing: Secondary | ICD-10-CM | POA: Diagnosis not present

## 2018-09-13 DIAGNOSIS — M4312 Spondylolisthesis, cervical region: Secondary | ICD-10-CM | POA: Diagnosis not present

## 2018-09-13 DIAGNOSIS — S12301D Unspecified nondisplaced fracture of fourth cervical vertebra, subsequent encounter for fracture with routine healing: Secondary | ICD-10-CM | POA: Diagnosis not present

## 2018-09-18 DIAGNOSIS — M4312 Spondylolisthesis, cervical region: Secondary | ICD-10-CM | POA: Diagnosis not present

## 2018-09-18 DIAGNOSIS — S14104D Unspecified injury at C4 level of cervical spinal cord, subsequent encounter: Secondary | ICD-10-CM | POA: Diagnosis not present

## 2018-09-18 DIAGNOSIS — S12301D Unspecified nondisplaced fracture of fourth cervical vertebra, subsequent encounter for fracture with routine healing: Secondary | ICD-10-CM | POA: Diagnosis not present

## 2018-09-18 DIAGNOSIS — S12401D Unspecified nondisplaced fracture of fifth cervical vertebra, subsequent encounter for fracture with routine healing: Secondary | ICD-10-CM | POA: Diagnosis not present

## 2018-09-18 DIAGNOSIS — S13150D Subluxation of C4/C5 cervical vertebrae, subsequent encounter: Secondary | ICD-10-CM | POA: Diagnosis not present

## 2018-09-18 DIAGNOSIS — M4812 Ankylosing hyperostosis [Forestier], cervical region: Secondary | ICD-10-CM | POA: Diagnosis not present

## 2018-09-20 DIAGNOSIS — H903 Sensorineural hearing loss, bilateral: Secondary | ICD-10-CM | POA: Diagnosis not present

## 2018-09-20 DIAGNOSIS — Z7289 Other problems related to lifestyle: Secondary | ICD-10-CM | POA: Diagnosis not present

## 2018-09-21 DIAGNOSIS — S14104D Unspecified injury at C4 level of cervical spinal cord, subsequent encounter: Secondary | ICD-10-CM | POA: Diagnosis not present

## 2018-09-21 DIAGNOSIS — S13150D Subluxation of C4/C5 cervical vertebrae, subsequent encounter: Secondary | ICD-10-CM | POA: Diagnosis not present

## 2018-09-21 DIAGNOSIS — S12401D Unspecified nondisplaced fracture of fifth cervical vertebra, subsequent encounter for fracture with routine healing: Secondary | ICD-10-CM | POA: Diagnosis not present

## 2018-09-21 DIAGNOSIS — S12301D Unspecified nondisplaced fracture of fourth cervical vertebra, subsequent encounter for fracture with routine healing: Secondary | ICD-10-CM | POA: Diagnosis not present

## 2018-09-21 DIAGNOSIS — M4812 Ankylosing hyperostosis [Forestier], cervical region: Secondary | ICD-10-CM | POA: Diagnosis not present

## 2018-09-21 DIAGNOSIS — M4312 Spondylolisthesis, cervical region: Secondary | ICD-10-CM | POA: Diagnosis not present

## 2018-09-26 DIAGNOSIS — Z6829 Body mass index (BMI) 29.0-29.9, adult: Secondary | ICD-10-CM | POA: Diagnosis not present

## 2018-09-26 DIAGNOSIS — I10 Essential (primary) hypertension: Secondary | ICD-10-CM | POA: Diagnosis not present

## 2018-09-26 DIAGNOSIS — M5020 Other cervical disc displacement, unspecified cervical region: Secondary | ICD-10-CM | POA: Diagnosis not present

## 2018-10-31 DIAGNOSIS — I10 Essential (primary) hypertension: Secondary | ICD-10-CM | POA: Diagnosis not present

## 2018-11-05 DIAGNOSIS — M542 Cervicalgia: Secondary | ICD-10-CM | POA: Diagnosis not present

## 2018-11-05 DIAGNOSIS — M5020 Other cervical disc displacement, unspecified cervical region: Secondary | ICD-10-CM | POA: Diagnosis not present

## 2018-11-05 DIAGNOSIS — S14104D Unspecified injury at C4 level of cervical spinal cord, subsequent encounter: Secondary | ICD-10-CM | POA: Diagnosis not present

## 2018-11-07 DIAGNOSIS — Z981 Arthrodesis status: Secondary | ICD-10-CM | POA: Diagnosis not present

## 2018-11-07 DIAGNOSIS — E668 Other obesity: Secondary | ICD-10-CM | POA: Diagnosis not present

## 2018-11-07 DIAGNOSIS — M25561 Pain in right knee: Secondary | ICD-10-CM | POA: Diagnosis not present

## 2018-11-07 DIAGNOSIS — I10 Essential (primary) hypertension: Secondary | ICD-10-CM | POA: Diagnosis not present

## 2018-11-07 DIAGNOSIS — Z6829 Body mass index (BMI) 29.0-29.9, adult: Secondary | ICD-10-CM | POA: Diagnosis not present

## 2018-11-07 DIAGNOSIS — J3089 Other allergic rhinitis: Secondary | ICD-10-CM | POA: Diagnosis not present

## 2018-11-07 DIAGNOSIS — N183 Chronic kidney disease, stage 3 (moderate): Secondary | ICD-10-CM | POA: Diagnosis not present

## 2018-11-15 DIAGNOSIS — H43813 Vitreous degeneration, bilateral: Secondary | ICD-10-CM | POA: Diagnosis not present

## 2018-11-15 DIAGNOSIS — H401131 Primary open-angle glaucoma, bilateral, mild stage: Secondary | ICD-10-CM | POA: Diagnosis not present

## 2018-11-15 DIAGNOSIS — H524 Presbyopia: Secondary | ICD-10-CM | POA: Diagnosis not present

## 2018-11-15 DIAGNOSIS — Z961 Presence of intraocular lens: Secondary | ICD-10-CM | POA: Diagnosis not present

## 2018-11-15 DIAGNOSIS — H353111 Nonexudative age-related macular degeneration, right eye, early dry stage: Secondary | ICD-10-CM | POA: Diagnosis not present

## 2019-01-14 DIAGNOSIS — S14104D Unspecified injury at C4 level of cervical spinal cord, subsequent encounter: Secondary | ICD-10-CM | POA: Diagnosis not present

## 2019-01-14 DIAGNOSIS — M5020 Other cervical disc displacement, unspecified cervical region: Secondary | ICD-10-CM | POA: Diagnosis not present

## 2019-01-14 DIAGNOSIS — M542 Cervicalgia: Secondary | ICD-10-CM | POA: Diagnosis not present

## 2019-03-18 DIAGNOSIS — M542 Cervicalgia: Secondary | ICD-10-CM | POA: Diagnosis not present

## 2019-03-18 DIAGNOSIS — I10 Essential (primary) hypertension: Secondary | ICD-10-CM | POA: Diagnosis not present

## 2019-03-18 DIAGNOSIS — Z6830 Body mass index (BMI) 30.0-30.9, adult: Secondary | ICD-10-CM | POA: Diagnosis not present

## 2019-03-18 DIAGNOSIS — S14104D Unspecified injury at C4 level of cervical spinal cord, subsequent encounter: Secondary | ICD-10-CM | POA: Diagnosis not present

## 2019-03-18 DIAGNOSIS — M5020 Other cervical disc displacement, unspecified cervical region: Secondary | ICD-10-CM | POA: Diagnosis not present

## 2019-04-11 DIAGNOSIS — H6091 Unspecified otitis externa, right ear: Secondary | ICD-10-CM | POA: Diagnosis not present

## 2019-05-16 DIAGNOSIS — Z961 Presence of intraocular lens: Secondary | ICD-10-CM | POA: Diagnosis not present

## 2019-05-16 DIAGNOSIS — H353111 Nonexudative age-related macular degeneration, right eye, early dry stage: Secondary | ICD-10-CM | POA: Diagnosis not present

## 2019-05-16 DIAGNOSIS — H401131 Primary open-angle glaucoma, bilateral, mild stage: Secondary | ICD-10-CM | POA: Diagnosis not present

## 2019-05-16 DIAGNOSIS — H43813 Vitreous degeneration, bilateral: Secondary | ICD-10-CM | POA: Diagnosis not present

## 2019-05-20 DIAGNOSIS — M21621 Bunionette of right foot: Secondary | ICD-10-CM | POA: Diagnosis not present

## 2019-05-20 DIAGNOSIS — M79671 Pain in right foot: Secondary | ICD-10-CM | POA: Diagnosis not present

## 2019-05-20 DIAGNOSIS — B957 Other staphylococcus as the cause of diseases classified elsewhere: Secondary | ICD-10-CM | POA: Diagnosis not present

## 2019-05-20 DIAGNOSIS — L02611 Cutaneous abscess of right foot: Secondary | ICD-10-CM | POA: Diagnosis not present

## 2019-05-20 DIAGNOSIS — Z1629 Resistance to other single specified antibiotic: Secondary | ICD-10-CM | POA: Diagnosis not present

## 2019-05-20 DIAGNOSIS — B9689 Other specified bacterial agents as the cause of diseases classified elsewhere: Secondary | ICD-10-CM | POA: Diagnosis not present

## 2019-05-20 DIAGNOSIS — Z1611 Resistance to penicillins: Secondary | ICD-10-CM | POA: Diagnosis not present

## 2019-05-20 DIAGNOSIS — B372 Candidiasis of skin and nail: Secondary | ICD-10-CM | POA: Diagnosis not present

## 2019-05-31 DIAGNOSIS — R05 Cough: Secondary | ICD-10-CM | POA: Diagnosis not present

## 2019-05-31 DIAGNOSIS — Z20818 Contact with and (suspected) exposure to other bacterial communicable diseases: Secondary | ICD-10-CM | POA: Diagnosis not present

## 2019-05-31 DIAGNOSIS — J069 Acute upper respiratory infection, unspecified: Secondary | ICD-10-CM | POA: Diagnosis not present

## 2019-06-03 DIAGNOSIS — L97512 Non-pressure chronic ulcer of other part of right foot with fat layer exposed: Secondary | ICD-10-CM | POA: Diagnosis not present

## 2019-06-24 DIAGNOSIS — L97511 Non-pressure chronic ulcer of other part of right foot limited to breakdown of skin: Secondary | ICD-10-CM | POA: Diagnosis not present

## 2019-08-05 DIAGNOSIS — M25571 Pain in right ankle and joints of right foot: Secondary | ICD-10-CM | POA: Diagnosis not present

## 2019-08-14 DIAGNOSIS — M109 Gout, unspecified: Secondary | ICD-10-CM | POA: Diagnosis not present

## 2019-08-14 DIAGNOSIS — E7849 Other hyperlipidemia: Secondary | ICD-10-CM | POA: Diagnosis not present

## 2019-08-14 DIAGNOSIS — Z125 Encounter for screening for malignant neoplasm of prostate: Secondary | ICD-10-CM | POA: Diagnosis not present

## 2019-08-20 DIAGNOSIS — Z0000 Encounter for general adult medical examination without abnormal findings: Secondary | ICD-10-CM | POA: Diagnosis not present

## 2019-08-20 DIAGNOSIS — R82998 Other abnormal findings in urine: Secondary | ICD-10-CM | POA: Diagnosis not present

## 2019-08-20 DIAGNOSIS — M109 Gout, unspecified: Secondary | ICD-10-CM | POA: Diagnosis not present

## 2019-08-20 DIAGNOSIS — E785 Hyperlipidemia, unspecified: Secondary | ICD-10-CM | POA: Diagnosis not present

## 2019-08-20 DIAGNOSIS — K219 Gastro-esophageal reflux disease without esophagitis: Secondary | ICD-10-CM | POA: Diagnosis not present

## 2019-08-20 DIAGNOSIS — M1990 Unspecified osteoarthritis, unspecified site: Secondary | ICD-10-CM | POA: Diagnosis not present

## 2019-08-20 DIAGNOSIS — I4510 Unspecified right bundle-branch block: Secondary | ICD-10-CM | POA: Diagnosis not present

## 2019-08-20 DIAGNOSIS — Z1331 Encounter for screening for depression: Secondary | ICD-10-CM | POA: Diagnosis not present

## 2019-08-20 DIAGNOSIS — N183 Chronic kidney disease, stage 3 unspecified: Secondary | ICD-10-CM | POA: Diagnosis not present

## 2019-08-20 DIAGNOSIS — M25561 Pain in right knee: Secondary | ICD-10-CM | POA: Diagnosis not present

## 2019-08-20 DIAGNOSIS — Z981 Arthrodesis status: Secondary | ICD-10-CM | POA: Diagnosis not present

## 2019-08-20 DIAGNOSIS — I129 Hypertensive chronic kidney disease with stage 1 through stage 4 chronic kidney disease, or unspecified chronic kidney disease: Secondary | ICD-10-CM | POA: Diagnosis not present

## 2019-08-20 DIAGNOSIS — E669 Obesity, unspecified: Secondary | ICD-10-CM | POA: Diagnosis not present

## 2019-08-20 DIAGNOSIS — I700 Atherosclerosis of aorta: Secondary | ICD-10-CM | POA: Diagnosis not present

## 2019-09-24 ENCOUNTER — Other Ambulatory Visit (HOSPITAL_COMMUNITY): Payer: Self-pay | Admitting: Otolaryngology

## 2019-09-24 ENCOUNTER — Other Ambulatory Visit: Payer: Self-pay | Admitting: Otolaryngology

## 2019-09-24 DIAGNOSIS — R1312 Dysphagia, oropharyngeal phase: Secondary | ICD-10-CM

## 2019-09-30 ENCOUNTER — Ambulatory Visit (HOSPITAL_COMMUNITY): Payer: Medicare Other

## 2019-09-30 ENCOUNTER — Encounter (HOSPITAL_COMMUNITY): Payer: Self-pay

## 2019-11-21 DIAGNOSIS — H401131 Primary open-angle glaucoma, bilateral, mild stage: Secondary | ICD-10-CM | POA: Diagnosis not present

## 2019-11-21 DIAGNOSIS — H43813 Vitreous degeneration, bilateral: Secondary | ICD-10-CM | POA: Diagnosis not present

## 2019-11-21 DIAGNOSIS — Z961 Presence of intraocular lens: Secondary | ICD-10-CM | POA: Diagnosis not present

## 2019-11-21 DIAGNOSIS — H353111 Nonexudative age-related macular degeneration, right eye, early dry stage: Secondary | ICD-10-CM | POA: Diagnosis not present

## 2019-12-30 DIAGNOSIS — M9903 Segmental and somatic dysfunction of lumbar region: Secondary | ICD-10-CM | POA: Diagnosis not present

## 2019-12-30 DIAGNOSIS — M5442 Lumbago with sciatica, left side: Secondary | ICD-10-CM | POA: Diagnosis not present

## 2019-12-30 DIAGNOSIS — M47812 Spondylosis without myelopathy or radiculopathy, cervical region: Secondary | ICD-10-CM | POA: Diagnosis not present

## 2019-12-30 DIAGNOSIS — M62830 Muscle spasm of back: Secondary | ICD-10-CM | POA: Diagnosis not present

## 2019-12-30 DIAGNOSIS — M9901 Segmental and somatic dysfunction of cervical region: Secondary | ICD-10-CM | POA: Diagnosis not present

## 2019-12-30 DIAGNOSIS — M9902 Segmental and somatic dysfunction of thoracic region: Secondary | ICD-10-CM | POA: Diagnosis not present

## 2019-12-31 DIAGNOSIS — M9903 Segmental and somatic dysfunction of lumbar region: Secondary | ICD-10-CM | POA: Diagnosis not present

## 2019-12-31 DIAGNOSIS — M62830 Muscle spasm of back: Secondary | ICD-10-CM | POA: Diagnosis not present

## 2019-12-31 DIAGNOSIS — M9901 Segmental and somatic dysfunction of cervical region: Secondary | ICD-10-CM | POA: Diagnosis not present

## 2019-12-31 DIAGNOSIS — M47812 Spondylosis without myelopathy or radiculopathy, cervical region: Secondary | ICD-10-CM | POA: Diagnosis not present

## 2019-12-31 DIAGNOSIS — M5442 Lumbago with sciatica, left side: Secondary | ICD-10-CM | POA: Diagnosis not present

## 2019-12-31 DIAGNOSIS — M9902 Segmental and somatic dysfunction of thoracic region: Secondary | ICD-10-CM | POA: Diagnosis not present

## 2020-01-06 DIAGNOSIS — M62830 Muscle spasm of back: Secondary | ICD-10-CM | POA: Diagnosis not present

## 2020-01-06 DIAGNOSIS — M47812 Spondylosis without myelopathy or radiculopathy, cervical region: Secondary | ICD-10-CM | POA: Diagnosis not present

## 2020-01-06 DIAGNOSIS — M9901 Segmental and somatic dysfunction of cervical region: Secondary | ICD-10-CM | POA: Diagnosis not present

## 2020-01-06 DIAGNOSIS — M9903 Segmental and somatic dysfunction of lumbar region: Secondary | ICD-10-CM | POA: Diagnosis not present

## 2020-01-06 DIAGNOSIS — M5442 Lumbago with sciatica, left side: Secondary | ICD-10-CM | POA: Diagnosis not present

## 2020-01-06 DIAGNOSIS — M9902 Segmental and somatic dysfunction of thoracic region: Secondary | ICD-10-CM | POA: Diagnosis not present

## 2020-01-08 DIAGNOSIS — M9902 Segmental and somatic dysfunction of thoracic region: Secondary | ICD-10-CM | POA: Diagnosis not present

## 2020-01-08 DIAGNOSIS — H401131 Primary open-angle glaucoma, bilateral, mild stage: Secondary | ICD-10-CM | POA: Diagnosis not present

## 2020-01-08 DIAGNOSIS — H10503 Unspecified blepharoconjunctivitis, bilateral: Secondary | ICD-10-CM | POA: Diagnosis not present

## 2020-01-08 DIAGNOSIS — M9903 Segmental and somatic dysfunction of lumbar region: Secondary | ICD-10-CM | POA: Diagnosis not present

## 2020-01-08 DIAGNOSIS — M5442 Lumbago with sciatica, left side: Secondary | ICD-10-CM | POA: Diagnosis not present

## 2020-01-08 DIAGNOSIS — Z961 Presence of intraocular lens: Secondary | ICD-10-CM | POA: Diagnosis not present

## 2020-01-08 DIAGNOSIS — M47812 Spondylosis without myelopathy or radiculopathy, cervical region: Secondary | ICD-10-CM | POA: Diagnosis not present

## 2020-01-08 DIAGNOSIS — M9901 Segmental and somatic dysfunction of cervical region: Secondary | ICD-10-CM | POA: Diagnosis not present

## 2020-01-08 DIAGNOSIS — M62830 Muscle spasm of back: Secondary | ICD-10-CM | POA: Diagnosis not present

## 2020-01-13 DIAGNOSIS — M9901 Segmental and somatic dysfunction of cervical region: Secondary | ICD-10-CM | POA: Diagnosis not present

## 2020-01-13 DIAGNOSIS — M47812 Spondylosis without myelopathy or radiculopathy, cervical region: Secondary | ICD-10-CM | POA: Diagnosis not present

## 2020-01-13 DIAGNOSIS — M5442 Lumbago with sciatica, left side: Secondary | ICD-10-CM | POA: Diagnosis not present

## 2020-01-13 DIAGNOSIS — M9902 Segmental and somatic dysfunction of thoracic region: Secondary | ICD-10-CM | POA: Diagnosis not present

## 2020-01-13 DIAGNOSIS — M9903 Segmental and somatic dysfunction of lumbar region: Secondary | ICD-10-CM | POA: Diagnosis not present

## 2020-01-13 DIAGNOSIS — M62830 Muscle spasm of back: Secondary | ICD-10-CM | POA: Diagnosis not present

## 2020-01-15 DIAGNOSIS — M5442 Lumbago with sciatica, left side: Secondary | ICD-10-CM | POA: Diagnosis not present

## 2020-01-15 DIAGNOSIS — M9903 Segmental and somatic dysfunction of lumbar region: Secondary | ICD-10-CM | POA: Diagnosis not present

## 2020-01-15 DIAGNOSIS — M47812 Spondylosis without myelopathy or radiculopathy, cervical region: Secondary | ICD-10-CM | POA: Diagnosis not present

## 2020-01-15 DIAGNOSIS — M9901 Segmental and somatic dysfunction of cervical region: Secondary | ICD-10-CM | POA: Diagnosis not present

## 2020-01-15 DIAGNOSIS — M9902 Segmental and somatic dysfunction of thoracic region: Secondary | ICD-10-CM | POA: Diagnosis not present

## 2020-01-15 DIAGNOSIS — M62830 Muscle spasm of back: Secondary | ICD-10-CM | POA: Diagnosis not present

## 2020-01-21 DIAGNOSIS — M9902 Segmental and somatic dysfunction of thoracic region: Secondary | ICD-10-CM | POA: Diagnosis not present

## 2020-01-21 DIAGNOSIS — M62830 Muscle spasm of back: Secondary | ICD-10-CM | POA: Diagnosis not present

## 2020-01-21 DIAGNOSIS — M47812 Spondylosis without myelopathy or radiculopathy, cervical region: Secondary | ICD-10-CM | POA: Diagnosis not present

## 2020-01-21 DIAGNOSIS — M9903 Segmental and somatic dysfunction of lumbar region: Secondary | ICD-10-CM | POA: Diagnosis not present

## 2020-01-21 DIAGNOSIS — M9901 Segmental and somatic dysfunction of cervical region: Secondary | ICD-10-CM | POA: Diagnosis not present

## 2020-01-21 DIAGNOSIS — M5442 Lumbago with sciatica, left side: Secondary | ICD-10-CM | POA: Diagnosis not present

## 2020-01-27 DIAGNOSIS — M47812 Spondylosis without myelopathy or radiculopathy, cervical region: Secondary | ICD-10-CM | POA: Diagnosis not present

## 2020-01-27 DIAGNOSIS — M9901 Segmental and somatic dysfunction of cervical region: Secondary | ICD-10-CM | POA: Diagnosis not present

## 2020-01-27 DIAGNOSIS — M9902 Segmental and somatic dysfunction of thoracic region: Secondary | ICD-10-CM | POA: Diagnosis not present

## 2020-01-27 DIAGNOSIS — M9903 Segmental and somatic dysfunction of lumbar region: Secondary | ICD-10-CM | POA: Diagnosis not present

## 2020-01-27 DIAGNOSIS — M5442 Lumbago with sciatica, left side: Secondary | ICD-10-CM | POA: Diagnosis not present

## 2020-01-27 DIAGNOSIS — M62830 Muscle spasm of back: Secondary | ICD-10-CM | POA: Diagnosis not present

## 2020-02-11 DIAGNOSIS — M47812 Spondylosis without myelopathy or radiculopathy, cervical region: Secondary | ICD-10-CM | POA: Diagnosis not present

## 2020-02-11 DIAGNOSIS — M9903 Segmental and somatic dysfunction of lumbar region: Secondary | ICD-10-CM | POA: Diagnosis not present

## 2020-02-11 DIAGNOSIS — M9902 Segmental and somatic dysfunction of thoracic region: Secondary | ICD-10-CM | POA: Diagnosis not present

## 2020-02-11 DIAGNOSIS — M9901 Segmental and somatic dysfunction of cervical region: Secondary | ICD-10-CM | POA: Diagnosis not present

## 2020-02-11 DIAGNOSIS — M62830 Muscle spasm of back: Secondary | ICD-10-CM | POA: Diagnosis not present

## 2020-02-11 DIAGNOSIS — M5442 Lumbago with sciatica, left side: Secondary | ICD-10-CM | POA: Diagnosis not present

## 2020-02-18 DIAGNOSIS — E669 Obesity, unspecified: Secondary | ICD-10-CM | POA: Diagnosis not present

## 2020-02-18 DIAGNOSIS — M1990 Unspecified osteoarthritis, unspecified site: Secondary | ICD-10-CM | POA: Diagnosis not present

## 2020-02-18 DIAGNOSIS — N183 Chronic kidney disease, stage 3 unspecified: Secondary | ICD-10-CM | POA: Diagnosis not present

## 2020-02-18 DIAGNOSIS — M25561 Pain in right knee: Secondary | ICD-10-CM | POA: Diagnosis not present

## 2020-02-18 DIAGNOSIS — M109 Gout, unspecified: Secondary | ICD-10-CM | POA: Diagnosis not present

## 2020-02-18 DIAGNOSIS — I129 Hypertensive chronic kidney disease with stage 1 through stage 4 chronic kidney disease, or unspecified chronic kidney disease: Secondary | ICD-10-CM | POA: Diagnosis not present

## 2020-02-18 DIAGNOSIS — Z981 Arthrodesis status: Secondary | ICD-10-CM | POA: Diagnosis not present

## 2020-02-19 DIAGNOSIS — M62830 Muscle spasm of back: Secondary | ICD-10-CM | POA: Diagnosis not present

## 2020-02-19 DIAGNOSIS — M9902 Segmental and somatic dysfunction of thoracic region: Secondary | ICD-10-CM | POA: Diagnosis not present

## 2020-02-19 DIAGNOSIS — M47812 Spondylosis without myelopathy or radiculopathy, cervical region: Secondary | ICD-10-CM | POA: Diagnosis not present

## 2020-02-19 DIAGNOSIS — M9903 Segmental and somatic dysfunction of lumbar region: Secondary | ICD-10-CM | POA: Diagnosis not present

## 2020-02-19 DIAGNOSIS — M9901 Segmental and somatic dysfunction of cervical region: Secondary | ICD-10-CM | POA: Diagnosis not present

## 2020-02-19 DIAGNOSIS — M5442 Lumbago with sciatica, left side: Secondary | ICD-10-CM | POA: Diagnosis not present

## 2020-02-25 DIAGNOSIS — M47812 Spondylosis without myelopathy or radiculopathy, cervical region: Secondary | ICD-10-CM | POA: Diagnosis not present

## 2020-02-25 DIAGNOSIS — M5442 Lumbago with sciatica, left side: Secondary | ICD-10-CM | POA: Diagnosis not present

## 2020-02-25 DIAGNOSIS — M9902 Segmental and somatic dysfunction of thoracic region: Secondary | ICD-10-CM | POA: Diagnosis not present

## 2020-02-25 DIAGNOSIS — M9901 Segmental and somatic dysfunction of cervical region: Secondary | ICD-10-CM | POA: Diagnosis not present

## 2020-02-25 DIAGNOSIS — M9903 Segmental and somatic dysfunction of lumbar region: Secondary | ICD-10-CM | POA: Diagnosis not present

## 2020-02-25 DIAGNOSIS — M62830 Muscle spasm of back: Secondary | ICD-10-CM | POA: Diagnosis not present

## 2020-05-13 DIAGNOSIS — H10503 Unspecified blepharoconjunctivitis, bilateral: Secondary | ICD-10-CM | POA: Diagnosis not present

## 2020-05-13 DIAGNOSIS — H353111 Nonexudative age-related macular degeneration, right eye, early dry stage: Secondary | ICD-10-CM | POA: Diagnosis not present

## 2020-05-13 DIAGNOSIS — H401131 Primary open-angle glaucoma, bilateral, mild stage: Secondary | ICD-10-CM | POA: Diagnosis not present

## 2020-05-13 DIAGNOSIS — Z961 Presence of intraocular lens: Secondary | ICD-10-CM | POA: Diagnosis not present

## 2020-06-02 DIAGNOSIS — M7541 Impingement syndrome of right shoulder: Secondary | ICD-10-CM | POA: Diagnosis not present

## 2020-06-02 DIAGNOSIS — M25511 Pain in right shoulder: Secondary | ICD-10-CM | POA: Diagnosis not present

## 2020-07-01 DIAGNOSIS — M25511 Pain in right shoulder: Secondary | ICD-10-CM | POA: Diagnosis not present

## 2020-07-18 DIAGNOSIS — M25511 Pain in right shoulder: Secondary | ICD-10-CM | POA: Diagnosis not present

## 2020-07-27 DIAGNOSIS — M25511 Pain in right shoulder: Secondary | ICD-10-CM | POA: Diagnosis not present

## 2020-09-10 IMAGING — DX DG CHEST 1V PORT
1 series · 1 of 1 positions shown · non-contrast
Comparison: None.

CLINICAL DATA: Fall.  Shoulder pain.

EXAM:
PORTABLE CHEST 1 VIEW

[chest ap]
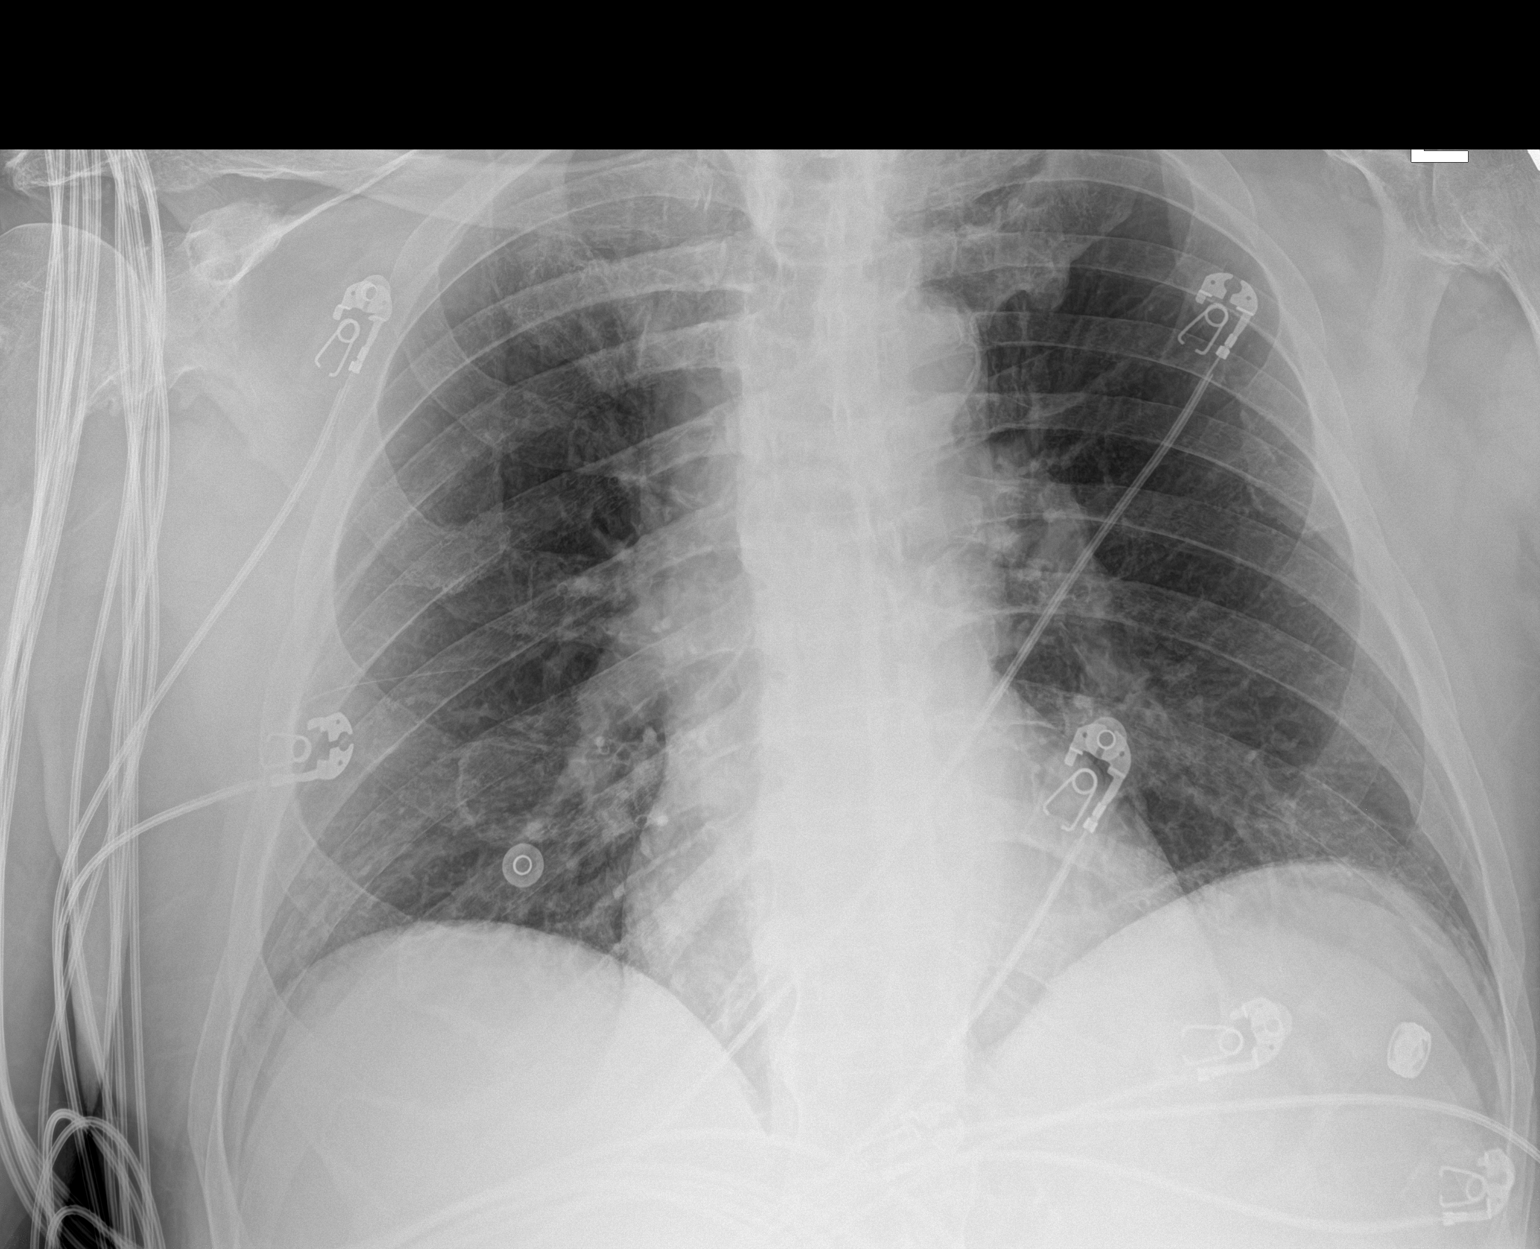

[1 of 1 positions shown; findings below may reference images not displayed]

FINDINGS: Artifact overlies the chest. Heart size is normal. There is aortic
atherosclerosis. Lungs are clear. The vascularity is normal. No
significant bone finding.
IMPRESSION: No active disease.

## 2020-09-10 IMAGING — DX DG SHOULDER 1V*L*
3 series · 3 of 3 positions shown · non-contrast
Comparison: None.

CLINICAL DATA: Fall from a stool.

EXAM:
LEFT SHOULDER - 1 VIEW

[shoulder obl]
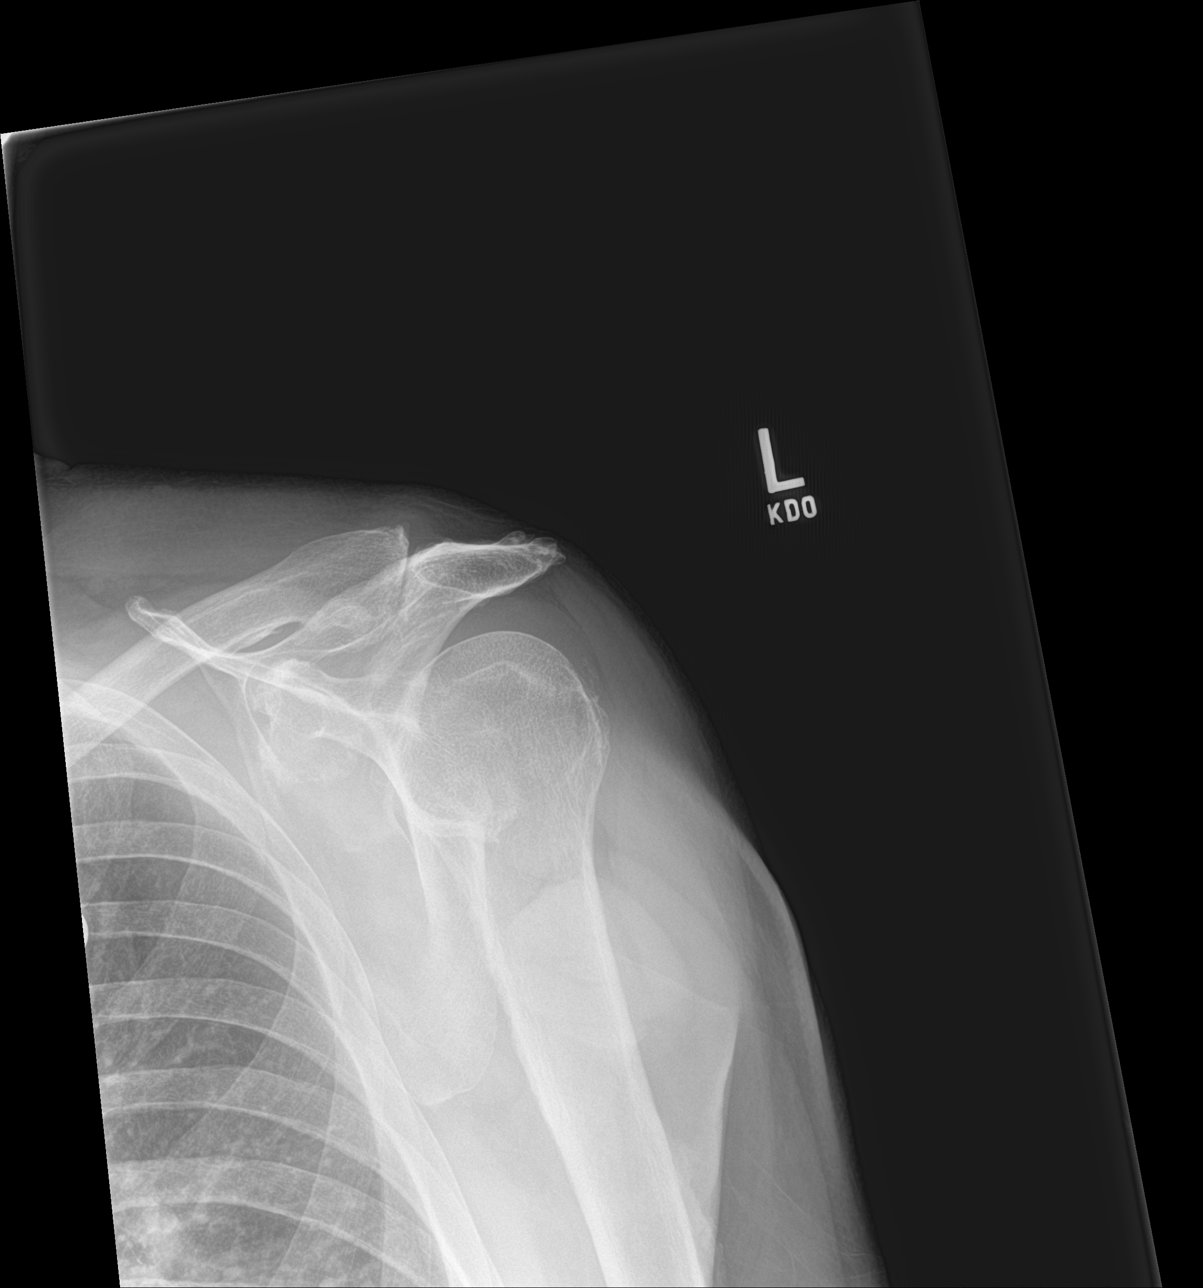

[shoulder ap (1 of 2)]
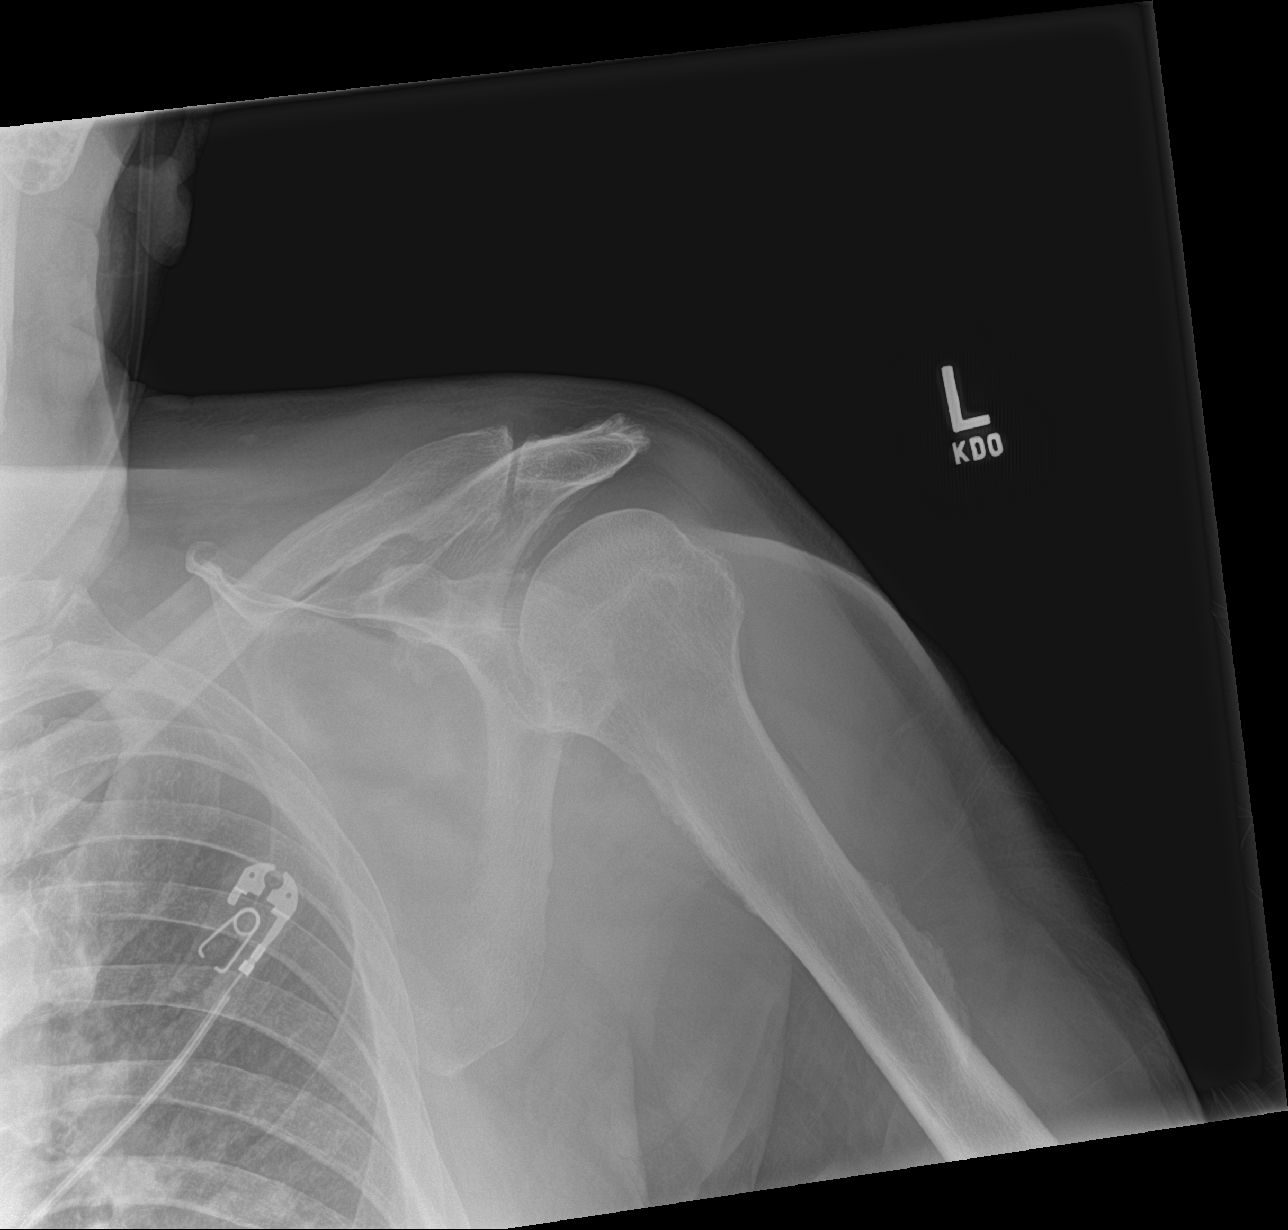

[shoulder ap (2 of 2)]
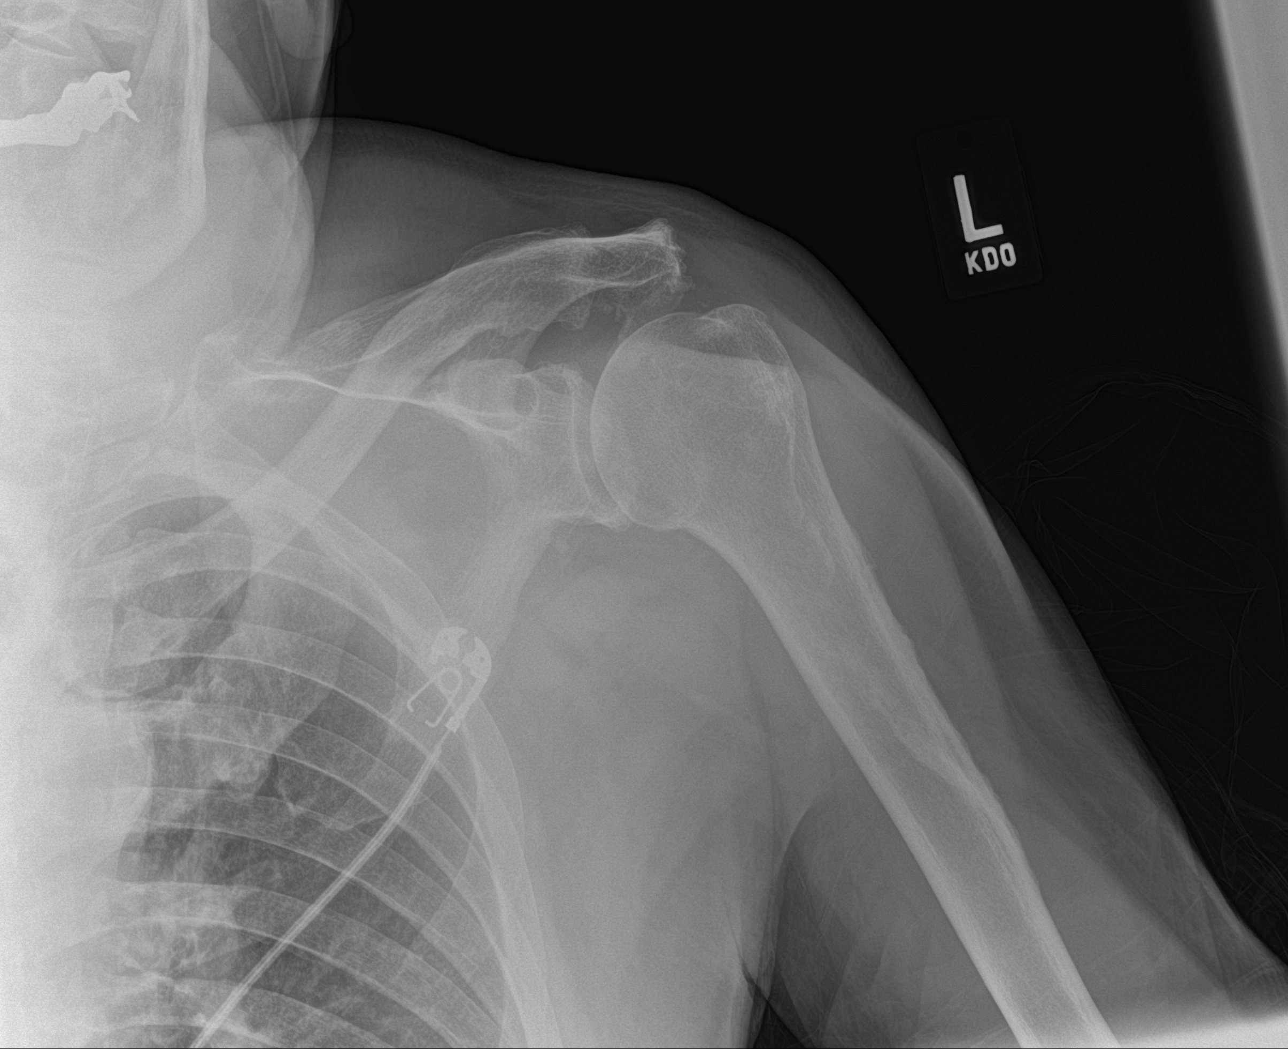

[3 of 3 positions shown; findings below may reference images not displayed]

FINDINGS: Mild-to-moderate degenerative spurring of the acromioclavicular
joint. Glenoid spurring. Faint calcifications are present in the
infraspinatus tendon region.

No appreciable fracture or dislocation.

Atherosclerotic calcification of the aortic arch.
IMPRESSION: 1. Degenerative AC joint arthropathy.
2. Calcific tendinopathy of the infraspinatus tendon.
3.  Aortic Atherosclerosis ([id number]).
4. Degenerative spurring of the glenoid.
5. No fracture or dislocation identified.

## 2020-09-11 IMAGING — CR DG CERVICAL SPINE 2 OR 3 VIEWS
1 series · 1 of 1 positions shown · non-contrast
Comparison: None.

CLINICAL DATA: C4-5 ACDF

EXAM:
CERVICAL SPINE - 2-3 VIEW

[xtable lateral]
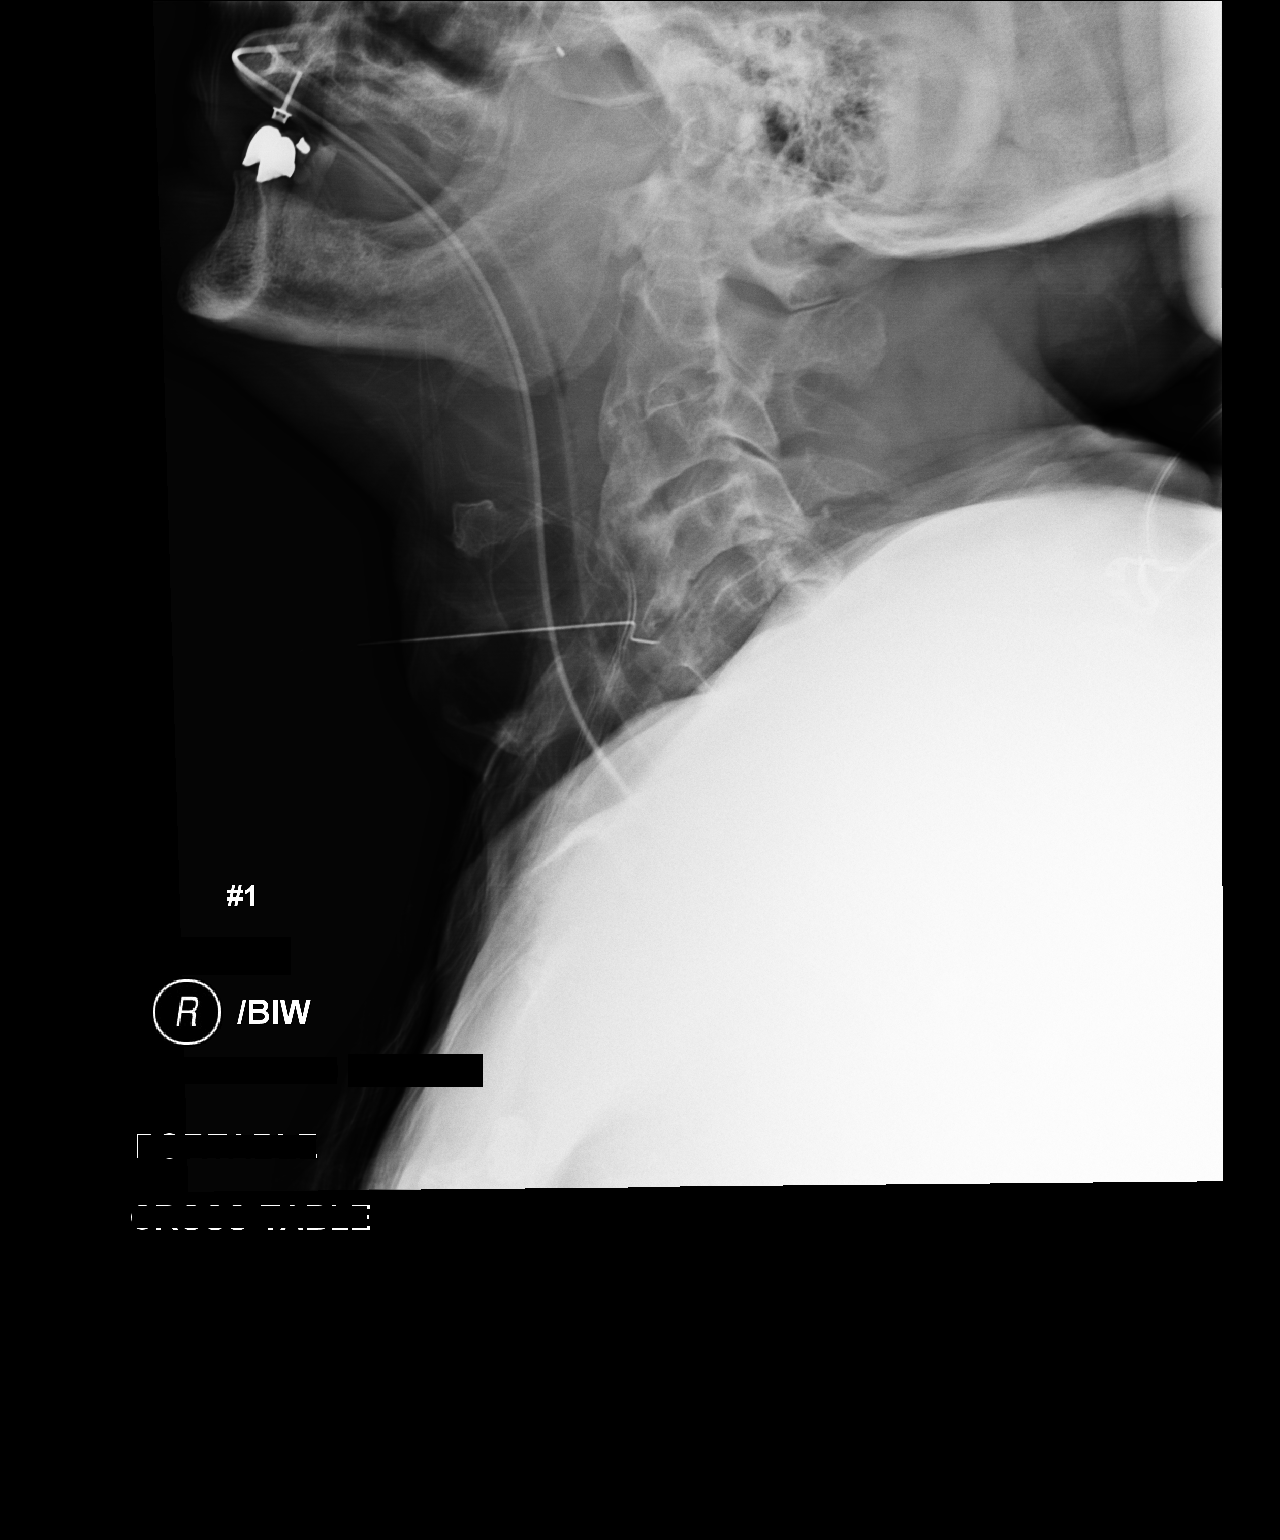

[1 of 1 positions shown; findings below may reference images not displayed]

FINDINGS: Three intraoperative radiographs are provided, all of which are
lateral views. The first view shows a probe at the C4-5 level. The
second and third views show ACDF hardware at the C4-5 level. There
are bulky anterior osteophytes at C2, C3 and C4.
IMPRESSION: Intraoperative images during C4-C5 ACDF.

## 2020-09-23 DIAGNOSIS — R350 Frequency of micturition: Secondary | ICD-10-CM | POA: Diagnosis not present

## 2020-09-23 DIAGNOSIS — R3915 Urgency of urination: Secondary | ICD-10-CM | POA: Diagnosis not present

## 2020-09-23 DIAGNOSIS — N401 Enlarged prostate with lower urinary tract symptoms: Secondary | ICD-10-CM | POA: Diagnosis not present

## 2020-09-29 DIAGNOSIS — Z125 Encounter for screening for malignant neoplasm of prostate: Secondary | ICD-10-CM | POA: Diagnosis not present

## 2020-09-29 DIAGNOSIS — E785 Hyperlipidemia, unspecified: Secondary | ICD-10-CM | POA: Diagnosis not present

## 2020-10-02 DIAGNOSIS — Z981 Arthrodesis status: Secondary | ICD-10-CM | POA: Diagnosis not present

## 2020-10-02 DIAGNOSIS — H409 Unspecified glaucoma: Secondary | ICD-10-CM | POA: Diagnosis not present

## 2020-10-02 DIAGNOSIS — E785 Hyperlipidemia, unspecified: Secondary | ICD-10-CM | POA: Diagnosis not present

## 2020-10-02 DIAGNOSIS — R82998 Other abnormal findings in urine: Secondary | ICD-10-CM | POA: Diagnosis not present

## 2020-10-02 DIAGNOSIS — Z0000 Encounter for general adult medical examination without abnormal findings: Secondary | ICD-10-CM | POA: Diagnosis not present

## 2020-10-02 DIAGNOSIS — M1990 Unspecified osteoarthritis, unspecified site: Secondary | ICD-10-CM | POA: Diagnosis not present

## 2020-10-02 DIAGNOSIS — I129 Hypertensive chronic kidney disease with stage 1 through stage 4 chronic kidney disease, or unspecified chronic kidney disease: Secondary | ICD-10-CM | POA: Diagnosis not present

## 2020-10-02 DIAGNOSIS — I700 Atherosclerosis of aorta: Secondary | ICD-10-CM | POA: Diagnosis not present

## 2020-10-02 DIAGNOSIS — N183 Chronic kidney disease, stage 3 unspecified: Secondary | ICD-10-CM | POA: Diagnosis not present

## 2020-10-02 DIAGNOSIS — N401 Enlarged prostate with lower urinary tract symptoms: Secondary | ICD-10-CM | POA: Diagnosis not present

## 2020-10-02 DIAGNOSIS — K219 Gastro-esophageal reflux disease without esophagitis: Secondary | ICD-10-CM | POA: Diagnosis not present

## 2020-10-02 DIAGNOSIS — E669 Obesity, unspecified: Secondary | ICD-10-CM | POA: Diagnosis not present

## 2020-10-02 DIAGNOSIS — I10 Essential (primary) hypertension: Secondary | ICD-10-CM | POA: Diagnosis not present

## 2021-02-08 DIAGNOSIS — H353111 Nonexudative age-related macular degeneration, right eye, early dry stage: Secondary | ICD-10-CM | POA: Diagnosis not present

## 2021-02-08 DIAGNOSIS — H43813 Vitreous degeneration, bilateral: Secondary | ICD-10-CM | POA: Diagnosis not present

## 2021-02-08 DIAGNOSIS — H10503 Unspecified blepharoconjunctivitis, bilateral: Secondary | ICD-10-CM | POA: Diagnosis not present

## 2021-02-08 DIAGNOSIS — H401131 Primary open-angle glaucoma, bilateral, mild stage: Secondary | ICD-10-CM | POA: Diagnosis not present

## 2021-04-16 DIAGNOSIS — R0981 Nasal congestion: Secondary | ICD-10-CM | POA: Diagnosis not present

## 2021-04-16 DIAGNOSIS — R051 Acute cough: Secondary | ICD-10-CM | POA: Diagnosis not present

## 2021-04-16 DIAGNOSIS — U071 COVID-19: Secondary | ICD-10-CM | POA: Diagnosis not present

## 2021-04-16 DIAGNOSIS — J029 Acute pharyngitis, unspecified: Secondary | ICD-10-CM | POA: Diagnosis not present

## 2021-04-16 DIAGNOSIS — J069 Acute upper respiratory infection, unspecified: Secondary | ICD-10-CM | POA: Diagnosis not present

## 2021-04-16 DIAGNOSIS — E669 Obesity, unspecified: Secondary | ICD-10-CM | POA: Diagnosis not present

## 2021-04-16 DIAGNOSIS — Z1152 Encounter for screening for COVID-19: Secondary | ICD-10-CM | POA: Diagnosis not present

## 2021-04-16 DIAGNOSIS — N183 Chronic kidney disease, stage 3 unspecified: Secondary | ICD-10-CM | POA: Diagnosis not present

## 2021-04-16 DIAGNOSIS — I129 Hypertensive chronic kidney disease with stage 1 through stage 4 chronic kidney disease, or unspecified chronic kidney disease: Secondary | ICD-10-CM | POA: Diagnosis not present

## 2021-04-16 DIAGNOSIS — E785 Hyperlipidemia, unspecified: Secondary | ICD-10-CM | POA: Diagnosis not present

## 2021-06-14 DIAGNOSIS — Z1152 Encounter for screening for COVID-19: Secondary | ICD-10-CM | POA: Diagnosis not present

## 2021-06-14 DIAGNOSIS — J40 Bronchitis, not specified as acute or chronic: Secondary | ICD-10-CM | POA: Diagnosis not present

## 2021-06-14 DIAGNOSIS — R051 Acute cough: Secondary | ICD-10-CM | POA: Diagnosis not present

## 2021-06-14 DIAGNOSIS — J309 Allergic rhinitis, unspecified: Secondary | ICD-10-CM | POA: Diagnosis not present

## 2021-06-14 DIAGNOSIS — R5383 Other fatigue: Secondary | ICD-10-CM | POA: Diagnosis not present

## 2021-06-14 DIAGNOSIS — R062 Wheezing: Secondary | ICD-10-CM | POA: Diagnosis not present

## 2021-06-14 DIAGNOSIS — R0981 Nasal congestion: Secondary | ICD-10-CM | POA: Diagnosis not present

## 2021-07-14 DIAGNOSIS — Z961 Presence of intraocular lens: Secondary | ICD-10-CM | POA: Diagnosis not present

## 2021-07-14 DIAGNOSIS — H401131 Primary open-angle glaucoma, bilateral, mild stage: Secondary | ICD-10-CM | POA: Diagnosis not present

## 2021-07-14 DIAGNOSIS — H353111 Nonexudative age-related macular degeneration, right eye, early dry stage: Secondary | ICD-10-CM | POA: Diagnosis not present

## 2021-10-14 DIAGNOSIS — Z125 Encounter for screening for malignant neoplasm of prostate: Secondary | ICD-10-CM | POA: Diagnosis not present

## 2021-10-14 DIAGNOSIS — M109 Gout, unspecified: Secondary | ICD-10-CM | POA: Diagnosis not present

## 2021-10-14 DIAGNOSIS — N183 Chronic kidney disease, stage 3 unspecified: Secondary | ICD-10-CM | POA: Diagnosis not present

## 2021-10-14 DIAGNOSIS — I129 Hypertensive chronic kidney disease with stage 1 through stage 4 chronic kidney disease, or unspecified chronic kidney disease: Secondary | ICD-10-CM | POA: Diagnosis not present

## 2021-10-14 DIAGNOSIS — E785 Hyperlipidemia, unspecified: Secondary | ICD-10-CM | POA: Diagnosis not present

## 2021-10-19 DIAGNOSIS — R350 Frequency of micturition: Secondary | ICD-10-CM | POA: Diagnosis not present

## 2021-10-19 DIAGNOSIS — N401 Enlarged prostate with lower urinary tract symptoms: Secondary | ICD-10-CM | POA: Diagnosis not present

## 2021-10-19 DIAGNOSIS — N3281 Overactive bladder: Secondary | ICD-10-CM | POA: Diagnosis not present

## 2021-10-19 DIAGNOSIS — R351 Nocturia: Secondary | ICD-10-CM | POA: Diagnosis not present

## 2021-10-20 DIAGNOSIS — H0102A Squamous blepharitis right eye, upper and lower eyelids: Secondary | ICD-10-CM | POA: Diagnosis not present

## 2021-10-20 DIAGNOSIS — H401131 Primary open-angle glaucoma, bilateral, mild stage: Secondary | ICD-10-CM | POA: Diagnosis not present

## 2021-10-20 DIAGNOSIS — H353111 Nonexudative age-related macular degeneration, right eye, early dry stage: Secondary | ICD-10-CM | POA: Diagnosis not present

## 2021-10-21 DIAGNOSIS — I129 Hypertensive chronic kidney disease with stage 1 through stage 4 chronic kidney disease, or unspecified chronic kidney disease: Secondary | ICD-10-CM | POA: Diagnosis not present

## 2021-10-21 DIAGNOSIS — K219 Gastro-esophageal reflux disease without esophagitis: Secondary | ICD-10-CM | POA: Diagnosis not present

## 2021-10-21 DIAGNOSIS — I700 Atherosclerosis of aorta: Secondary | ICD-10-CM | POA: Diagnosis not present

## 2021-10-21 DIAGNOSIS — R634 Abnormal weight loss: Secondary | ICD-10-CM | POA: Diagnosis not present

## 2021-10-21 DIAGNOSIS — R82998 Other abnormal findings in urine: Secondary | ICD-10-CM | POA: Diagnosis not present

## 2021-10-21 DIAGNOSIS — I10 Essential (primary) hypertension: Secondary | ICD-10-CM | POA: Diagnosis not present

## 2021-10-21 DIAGNOSIS — Z0000 Encounter for general adult medical examination without abnormal findings: Secondary | ICD-10-CM | POA: Diagnosis not present

## 2021-10-21 DIAGNOSIS — Z981 Arthrodesis status: Secondary | ICD-10-CM | POA: Diagnosis not present

## 2021-10-21 DIAGNOSIS — M1990 Unspecified osteoarthritis, unspecified site: Secondary | ICD-10-CM | POA: Diagnosis not present

## 2021-10-21 DIAGNOSIS — N401 Enlarged prostate with lower urinary tract symptoms: Secondary | ICD-10-CM | POA: Diagnosis not present

## 2021-10-21 DIAGNOSIS — E785 Hyperlipidemia, unspecified: Secondary | ICD-10-CM | POA: Diagnosis not present

## 2021-10-21 DIAGNOSIS — N183 Chronic kidney disease, stage 3 unspecified: Secondary | ICD-10-CM | POA: Diagnosis not present

## 2021-12-22 DIAGNOSIS — H0102A Squamous blepharitis right eye, upper and lower eyelids: Secondary | ICD-10-CM | POA: Diagnosis not present

## 2021-12-22 DIAGNOSIS — H401131 Primary open-angle glaucoma, bilateral, mild stage: Secondary | ICD-10-CM | POA: Diagnosis not present

## 2022-02-23 DIAGNOSIS — E785 Hyperlipidemia, unspecified: Secondary | ICD-10-CM | POA: Diagnosis not present

## 2022-02-23 DIAGNOSIS — Z981 Arthrodesis status: Secondary | ICD-10-CM | POA: Diagnosis not present

## 2022-02-23 DIAGNOSIS — K219 Gastro-esophageal reflux disease without esophagitis: Secondary | ICD-10-CM | POA: Diagnosis not present

## 2022-02-23 DIAGNOSIS — N183 Chronic kidney disease, stage 3 unspecified: Secondary | ICD-10-CM | POA: Diagnosis not present

## 2022-02-23 DIAGNOSIS — I700 Atherosclerosis of aorta: Secondary | ICD-10-CM | POA: Diagnosis not present

## 2022-02-23 DIAGNOSIS — M1990 Unspecified osteoarthritis, unspecified site: Secondary | ICD-10-CM | POA: Diagnosis not present

## 2022-02-23 DIAGNOSIS — N401 Enlarged prostate with lower urinary tract symptoms: Secondary | ICD-10-CM | POA: Diagnosis not present

## 2022-02-23 DIAGNOSIS — I129 Hypertensive chronic kidney disease with stage 1 through stage 4 chronic kidney disease, or unspecified chronic kidney disease: Secondary | ICD-10-CM | POA: Diagnosis not present

## 2022-02-23 DIAGNOSIS — E669 Obesity, unspecified: Secondary | ICD-10-CM | POA: Diagnosis not present

## 2022-03-21 DIAGNOSIS — L308 Other specified dermatitis: Secondary | ICD-10-CM | POA: Diagnosis not present

## 2022-03-28 DIAGNOSIS — H401131 Primary open-angle glaucoma, bilateral, mild stage: Secondary | ICD-10-CM | POA: Diagnosis not present

## 2022-04-12 ENCOUNTER — Ambulatory Visit (INDEPENDENT_AMBULATORY_CARE_PROVIDER_SITE_OTHER): Payer: Medicare Other | Admitting: Dermatology

## 2022-04-12 ENCOUNTER — Encounter: Payer: Self-pay | Admitting: Dermatology

## 2022-04-12 VITALS — BP 129/72 | HR 79 | Wt 170.0 lb

## 2022-04-12 DIAGNOSIS — R21 Rash and other nonspecific skin eruption: Secondary | ICD-10-CM

## 2022-04-12 MED ORDER — PREDNISONE 20 MG PO TABS: ORAL_TABLET | ORAL | 0 refills | Status: DC

## 2022-04-12 NOTE — Patient Instructions (Signed)
20 mg tablet take  3 a day for 4 days  2 a day for 3 days  1 for 4 days  This is a taper,  please call if you have any questions.

## 2022-05-04 ENCOUNTER — Encounter: Payer: Self-pay | Admitting: Dermatology

## 2022-05-04 NOTE — Progress Notes (Signed)
   New Patient   Subjective  Samuel Cortez is a 81 y.o. male who presents for the following: Rash (Knees up rash  x 2 weeks no changes no yard work no med change).   Pruritic rash for 2 weeks Location:  Duration:  Quality:  Associated Signs/Symptoms: Modifying Factors:  Severity:  Timing: Context:    The following portions of the chart were reviewed this encounter and updated as appropriate:  Tobacco  Allergies  Meds  Problems  Med Hx  Surg Hx  Fam Hx      Objective  Well appearing patient in no apparent distress; mood and affect are within normal limits. Chest (Upper Torso, Anterior) Good extended history and review of systems.  There are no close contacts similar affected.  He does not recall having any possible contact exposure before the onset of the rash.  No new medications.  Denies having same any other healthcare provider or being placed on any medications for the rash.  Examination showed a widespread urticarial dermatitis mostly on the upper torso but also involving arms and legs.  No mucosal changes.  No active synovitis.  I told patient that an acute onset symmetric inflammatory process usually involves an immune mediated process, but neither he nor I can pinpoint what set this off.  The intensity and negative impact on his life do justify a one-time prednisone taper; all potential side effects of this medication were detailed.  He understands that this will not be a refillable medication.    A full examination was performed including scalp, head, eyes, ears, nose, lips, neck, chest, axillae, abdomen, back, buttocks, bilateral upper extremities, bilateral lower extremities, hands, feet, fingers, toes, fingernails, and toenails. All findings within normal limits unless otherwise noted below.   Assessment & Plan  Rash and other nonspecific skin eruption Chest (Upper Torso, Anterior)  B/P 129/72 WEIGHT 170 PULSE 79 No history of tuberculosis, stomach ulcers,  severe mental health problems, diabetes.  He will take a some taper over 12 days beginning at roughly 3/4 of milligram per kilogram.  Follow-up in 3 to 4 days to let me know he is improving and again 1 week after his last medication to let me know there is no post prednisone rebound.  predniSONE (DELTASONE) 20 MG tablet - Chest (Upper Torso, Anterior) 20 mg 3 a day for 4 days 2 a day for 3 days 1 for 4 days

## 2022-05-25 DIAGNOSIS — L309 Dermatitis, unspecified: Secondary | ICD-10-CM | POA: Diagnosis not present

## 2022-05-25 DIAGNOSIS — D692 Other nonthrombocytopenic purpura: Secondary | ICD-10-CM | POA: Diagnosis not present

## 2022-05-25 DIAGNOSIS — Z85828 Personal history of other malignant neoplasm of skin: Secondary | ICD-10-CM | POA: Diagnosis not present

## 2022-05-25 DIAGNOSIS — L218 Other seborrheic dermatitis: Secondary | ICD-10-CM | POA: Diagnosis not present

## 2022-06-10 DIAGNOSIS — N401 Enlarged prostate with lower urinary tract symptoms: Secondary | ICD-10-CM | POA: Diagnosis not present

## 2022-06-10 DIAGNOSIS — L309 Dermatitis, unspecified: Secondary | ICD-10-CM | POA: Diagnosis not present

## 2022-06-10 DIAGNOSIS — R070 Pain in throat: Secondary | ICD-10-CM | POA: Diagnosis not present

## 2022-06-10 DIAGNOSIS — Z981 Arthrodesis status: Secondary | ICD-10-CM | POA: Diagnosis not present

## 2022-06-10 DIAGNOSIS — Z85828 Personal history of other malignant neoplasm of skin: Secondary | ICD-10-CM | POA: Diagnosis not present

## 2022-06-10 DIAGNOSIS — K219 Gastro-esophageal reflux disease without esophagitis: Secondary | ICD-10-CM | POA: Diagnosis not present

## 2022-06-10 DIAGNOSIS — L308 Other specified dermatitis: Secondary | ICD-10-CM | POA: Diagnosis not present

## 2022-06-10 DIAGNOSIS — M1990 Unspecified osteoarthritis, unspecified site: Secondary | ICD-10-CM | POA: Diagnosis not present

## 2022-06-10 DIAGNOSIS — I129 Hypertensive chronic kidney disease with stage 1 through stage 4 chronic kidney disease, or unspecified chronic kidney disease: Secondary | ICD-10-CM | POA: Diagnosis not present

## 2022-06-10 DIAGNOSIS — I700 Atherosclerosis of aorta: Secondary | ICD-10-CM | POA: Diagnosis not present

## 2022-06-10 DIAGNOSIS — E785 Hyperlipidemia, unspecified: Secondary | ICD-10-CM | POA: Diagnosis not present

## 2022-06-10 DIAGNOSIS — L299 Pruritus, unspecified: Secondary | ICD-10-CM | POA: Diagnosis not present

## 2022-06-10 DIAGNOSIS — R221 Localized swelling, mass and lump, neck: Secondary | ICD-10-CM | POA: Diagnosis not present

## 2022-06-10 DIAGNOSIS — E669 Obesity, unspecified: Secondary | ICD-10-CM | POA: Diagnosis not present

## 2022-06-10 DIAGNOSIS — N183 Chronic kidney disease, stage 3 unspecified: Secondary | ICD-10-CM | POA: Diagnosis not present

## 2022-06-15 ENCOUNTER — Other Ambulatory Visit: Payer: Self-pay | Admitting: Internal Medicine

## 2022-06-15 ENCOUNTER — Ambulatory Visit
Admission: RE | Admit: 2022-06-15 | Discharge: 2022-06-15 | Disposition: A | Discharge status: Home / Self Care | Payer: Medicare Other | Source: Ambulatory Visit | Attending: Internal Medicine | Admitting: Internal Medicine

## 2022-06-15 DIAGNOSIS — R221 Localized swelling, mass and lump, neck: Secondary | ICD-10-CM

## 2022-06-15 DIAGNOSIS — R1310 Dysphagia, unspecified: Secondary | ICD-10-CM | POA: Diagnosis not present

## 2022-06-15 DIAGNOSIS — M19011 Primary osteoarthritis, right shoulder: Secondary | ICD-10-CM | POA: Diagnosis not present

## 2022-06-15 DIAGNOSIS — M19012 Primary osteoarthritis, left shoulder: Secondary | ICD-10-CM | POA: Diagnosis not present

## 2022-06-15 DIAGNOSIS — M542 Cervicalgia: Secondary | ICD-10-CM | POA: Diagnosis not present

## 2022-06-15 MED ORDER — IOPAMIDOL (ISOVUE-300) INJECTION 61%
75.0000 mL | Freq: Once | INTRAVENOUS | Status: AC | PRN
  Administered 2022-06-15: 75 mL via INTRAVENOUS

## 2022-07-05 DIAGNOSIS — Z85828 Personal history of other malignant neoplasm of skin: Secondary | ICD-10-CM | POA: Diagnosis not present

## 2022-07-05 DIAGNOSIS — L309 Dermatitis, unspecified: Secondary | ICD-10-CM | POA: Diagnosis not present

## 2022-08-01 DIAGNOSIS — H43813 Vitreous degeneration, bilateral: Secondary | ICD-10-CM | POA: Diagnosis not present

## 2022-08-01 DIAGNOSIS — H401131 Primary open-angle glaucoma, bilateral, mild stage: Secondary | ICD-10-CM | POA: Diagnosis not present

## 2022-08-01 DIAGNOSIS — Z961 Presence of intraocular lens: Secondary | ICD-10-CM | POA: Diagnosis not present

## 2022-08-01 DIAGNOSIS — H353131 Nonexudative age-related macular degeneration, bilateral, early dry stage: Secondary | ICD-10-CM | POA: Diagnosis not present

## 2022-08-15 DIAGNOSIS — L309 Dermatitis, unspecified: Secondary | ICD-10-CM | POA: Diagnosis not present

## 2022-08-15 DIAGNOSIS — Z85828 Personal history of other malignant neoplasm of skin: Secondary | ICD-10-CM | POA: Diagnosis not present

## 2022-08-15 DIAGNOSIS — L218 Other seborrheic dermatitis: Secondary | ICD-10-CM | POA: Diagnosis not present

## 2022-08-15 DIAGNOSIS — L300 Nummular dermatitis: Secondary | ICD-10-CM | POA: Diagnosis not present

## 2022-10-19 DIAGNOSIS — R350 Frequency of micturition: Secondary | ICD-10-CM | POA: Diagnosis not present

## 2022-10-19 DIAGNOSIS — R351 Nocturia: Secondary | ICD-10-CM | POA: Diagnosis not present

## 2022-10-26 DIAGNOSIS — R1310 Dysphagia, unspecified: Secondary | ICD-10-CM | POA: Diagnosis not present

## 2022-10-26 DIAGNOSIS — Z6826 Body mass index (BMI) 26.0-26.9, adult: Secondary | ICD-10-CM | POA: Diagnosis not present

## 2022-11-01 ENCOUNTER — Telehealth (HOSPITAL_COMMUNITY): Payer: Self-pay

## 2022-11-01 NOTE — Telephone Encounter (Signed)
Attempted to contact patient to schedule OP Modified Barium Swallow - left voicemail.

## 2022-11-02 DIAGNOSIS — Z125 Encounter for screening for malignant neoplasm of prostate: Secondary | ICD-10-CM | POA: Diagnosis not present

## 2022-11-02 DIAGNOSIS — R7989 Other specified abnormal findings of blood chemistry: Secondary | ICD-10-CM | POA: Diagnosis not present

## 2022-11-02 DIAGNOSIS — E785 Hyperlipidemia, unspecified: Secondary | ICD-10-CM | POA: Diagnosis not present

## 2022-11-02 DIAGNOSIS — I129 Hypertensive chronic kidney disease with stage 1 through stage 4 chronic kidney disease, or unspecified chronic kidney disease: Secondary | ICD-10-CM | POA: Diagnosis not present

## 2022-11-03 ENCOUNTER — Other Ambulatory Visit (HOSPITAL_COMMUNITY): Payer: Self-pay

## 2022-11-03 DIAGNOSIS — R1310 Dysphagia, unspecified: Secondary | ICD-10-CM

## 2022-11-03 DIAGNOSIS — R059 Cough, unspecified: Secondary | ICD-10-CM

## 2022-11-09 DIAGNOSIS — Z981 Arthrodesis status: Secondary | ICD-10-CM | POA: Diagnosis not present

## 2022-11-09 DIAGNOSIS — I700 Atherosclerosis of aorta: Secondary | ICD-10-CM | POA: Diagnosis not present

## 2022-11-09 DIAGNOSIS — I129 Hypertensive chronic kidney disease with stage 1 through stage 4 chronic kidney disease, or unspecified chronic kidney disease: Secondary | ICD-10-CM | POA: Diagnosis not present

## 2022-11-09 DIAGNOSIS — Z0000 Encounter for general adult medical examination without abnormal findings: Secondary | ICD-10-CM | POA: Diagnosis not present

## 2022-11-09 DIAGNOSIS — E785 Hyperlipidemia, unspecified: Secondary | ICD-10-CM | POA: Diagnosis not present

## 2022-11-09 DIAGNOSIS — K219 Gastro-esophageal reflux disease without esophagitis: Secondary | ICD-10-CM | POA: Diagnosis not present

## 2022-11-09 DIAGNOSIS — N183 Chronic kidney disease, stage 3 unspecified: Secondary | ICD-10-CM | POA: Diagnosis not present

## 2022-11-09 DIAGNOSIS — L309 Dermatitis, unspecified: Secondary | ICD-10-CM | POA: Diagnosis not present

## 2022-11-09 DIAGNOSIS — M1990 Unspecified osteoarthritis, unspecified site: Secondary | ICD-10-CM | POA: Diagnosis not present

## 2022-11-09 DIAGNOSIS — R070 Pain in throat: Secondary | ICD-10-CM | POA: Diagnosis not present

## 2022-11-09 DIAGNOSIS — M1120 Other chondrocalcinosis, unspecified site: Secondary | ICD-10-CM | POA: Diagnosis not present

## 2022-11-15 ENCOUNTER — Ambulatory Visit (HOSPITAL_COMMUNITY)
Admission: RE | Admit: 2022-11-15 | Discharge: 2022-11-15 | Disposition: A | Discharge status: Home / Self Care | Payer: Medicare Other | Source: Ambulatory Visit | Attending: Internal Medicine | Admitting: Internal Medicine

## 2022-11-15 DIAGNOSIS — R1310 Dysphagia, unspecified: Secondary | ICD-10-CM | POA: Diagnosis not present

## 2022-11-15 DIAGNOSIS — R1314 Dysphagia, pharyngoesophageal phase: Secondary | ICD-10-CM | POA: Diagnosis not present

## 2022-11-15 DIAGNOSIS — R059 Cough, unspecified: Secondary | ICD-10-CM

## 2022-11-15 NOTE — Progress Notes (Signed)
Modified Barium Swallow Study  Patient Details  Name: Samuel Cortez MRN: [id number] Date of Birth: 12/02/1940  Today's Date: 11/15/2022  Modified Barium Swallow completed.  Full report located under Chart Review in the Imaging Section.  History of Present Illness Pt is an 82 yo male referred for MBS after reporting difficulty swallowing solids. Large ventral osteophytosis ventral to C3-C4 with more profound with significant mass at border of clavicle (per MD, suspected pseudogout). Pt had surgical decompression athrodesis at the level of C4-5 (2019) with progressive swallowing difficulties and pain on L side of neck, behind the ear. PCP notes pocketing of phlegm and food as well as voice changes. PMH includes arthritis, gout, HTN   Clinical Impression Pt's oral function is WFL, although it appears he uses piecemeal swallowing when presented with larger volumes as a compensatory strategy to account for reduced pharyngeal function. His prominent osteophytes and cervical hardware contribute to narrowing from the level of the valleculae through the level of the PES, which impacts pt's ability to achieve complete epiglottic inversion and laryngeal elevation, which in turn, affects his ability to adequately close his laryngeal vestibule. This results in penetration (PAS 2) of all consistencies. Significant narrowing of the pharynx from structural changes and reduced base of tongue retraction, result in residue present in the valleculae following trials of all consistencies. Additionally, it contributes to partial or minimal PES opening, resulting in esophageal backflow. This contributes to volume of residue present in pharyngeal structures after the swallow but is reduced with reflexive subswallows. Pt states that he has trouble swallowing large pills and was provided a barium tablet that was approximately 1/4 of the standard size, which he cleared. At baseline, pt is effectively compensating for these  deficits by frequently clearing his throat and swallowing multiple times. Pt educated on further progression and potential for increased risk of swallowing difficulties or decreased ability to compensate with any acute changes in overall health. Education provided for potential for outpatient SLP referral to maintain current level of muscular strength as a precaution for any decline in swallowing function. Could consider EMST to maintain strength of cough for protective measures. Recommend regular diet with thin liquids and continued use of compensatory strategies (multiple swallows and intermittent throat clears). Factors that may increase risk of adverse event in presence of aspiration ([place] 2021): Frail or deconditioned;Limited mobility  Swallow Evaluation Recommendations Recommendations: PO diet PO Diet Recommendation: Regular;Thin liquids (Level 0) Liquid Administration via: Cup;Straw Medication Administration: Whole meds with liquid Supervision: Patient able to self-feed Swallowing strategies  : Slow rate;Small bites/sips;Multiple dry swallows after each bite/sip;Clear throat intermittently Postural changes: Position pt fully upright for meals;Stay upright 30-60 min after meals Oral care recommendations: Oral care BID (2x/day)      Fabio Asa., Student SLP  11/15/2022,3:14 PM

## 2022-11-18 DIAGNOSIS — R1310 Dysphagia, unspecified: Secondary | ICD-10-CM | POA: Diagnosis not present

## 2022-12-22 DIAGNOSIS — M2560 Stiffness of unspecified joint, not elsewhere classified: Secondary | ICD-10-CM | POA: Diagnosis not present

## 2022-12-22 DIAGNOSIS — E663 Overweight: Secondary | ICD-10-CM | POA: Diagnosis not present

## 2022-12-22 DIAGNOSIS — M2540 Effusion, unspecified joint: Secondary | ICD-10-CM | POA: Diagnosis not present

## 2022-12-22 DIAGNOSIS — Z111 Encounter for screening for respiratory tuberculosis: Secondary | ICD-10-CM | POA: Diagnosis not present

## 2022-12-22 DIAGNOSIS — R21 Rash and other nonspecific skin eruption: Secondary | ICD-10-CM | POA: Diagnosis not present

## 2022-12-22 DIAGNOSIS — R042 Hemoptysis: Secondary | ICD-10-CM | POA: Diagnosis not present

## 2022-12-22 DIAGNOSIS — M542 Cervicalgia: Secondary | ICD-10-CM | POA: Diagnosis not present

## 2022-12-22 DIAGNOSIS — Z6826 Body mass index (BMI) 26.0-26.9, adult: Secondary | ICD-10-CM | POA: Diagnosis not present

## 2022-12-22 DIAGNOSIS — R634 Abnormal weight loss: Secondary | ICD-10-CM | POA: Diagnosis not present

## 2022-12-22 DIAGNOSIS — M25512 Pain in left shoulder: Secondary | ICD-10-CM | POA: Diagnosis not present

## 2023-01-09 DIAGNOSIS — M2560 Stiffness of unspecified joint, not elsewhere classified: Secondary | ICD-10-CM | POA: Diagnosis not present

## 2023-01-09 DIAGNOSIS — E663 Overweight: Secondary | ICD-10-CM | POA: Diagnosis not present

## 2023-01-09 DIAGNOSIS — M2540 Effusion, unspecified joint: Secondary | ICD-10-CM | POA: Diagnosis not present

## 2023-01-09 DIAGNOSIS — Z6825 Body mass index (BMI) 25.0-25.9, adult: Secondary | ICD-10-CM | POA: Diagnosis not present

## 2023-01-09 DIAGNOSIS — M542 Cervicalgia: Secondary | ICD-10-CM | POA: Diagnosis not present

## 2023-01-09 DIAGNOSIS — R21 Rash and other nonspecific skin eruption: Secondary | ICD-10-CM | POA: Diagnosis not present

## 2023-01-09 DIAGNOSIS — Z79899 Other long term (current) drug therapy: Secondary | ICD-10-CM | POA: Diagnosis not present

## 2023-03-06 DIAGNOSIS — M21371 Foot drop, right foot: Secondary | ICD-10-CM | POA: Diagnosis not present

## 2023-03-06 DIAGNOSIS — R634 Abnormal weight loss: Secondary | ICD-10-CM | POA: Diagnosis not present

## 2023-03-06 DIAGNOSIS — R070 Pain in throat: Secondary | ICD-10-CM | POA: Diagnosis not present

## 2023-03-06 DIAGNOSIS — Z981 Arthrodesis status: Secondary | ICD-10-CM | POA: Diagnosis not present

## 2023-03-06 DIAGNOSIS — I700 Atherosclerosis of aorta: Secondary | ICD-10-CM | POA: Diagnosis not present

## 2023-03-06 DIAGNOSIS — R112 Nausea with vomiting, unspecified: Secondary | ICD-10-CM | POA: Diagnosis not present

## 2023-03-06 DIAGNOSIS — R61 Generalized hyperhidrosis: Secondary | ICD-10-CM | POA: Diagnosis not present

## 2023-03-06 DIAGNOSIS — M1120 Other chondrocalcinosis, unspecified site: Secondary | ICD-10-CM | POA: Diagnosis not present

## 2023-03-06 DIAGNOSIS — I129 Hypertensive chronic kidney disease with stage 1 through stage 4 chronic kidney disease, or unspecified chronic kidney disease: Secondary | ICD-10-CM | POA: Diagnosis not present

## 2023-03-06 DIAGNOSIS — E785 Hyperlipidemia, unspecified: Secondary | ICD-10-CM | POA: Diagnosis not present

## 2023-03-06 DIAGNOSIS — K219 Gastro-esophageal reflux disease without esophagitis: Secondary | ICD-10-CM | POA: Diagnosis not present

## 2023-03-06 DIAGNOSIS — N183 Chronic kidney disease, stage 3 unspecified: Secondary | ICD-10-CM | POA: Diagnosis not present

## 2023-03-10 DIAGNOSIS — M21371 Foot drop, right foot: Secondary | ICD-10-CM | POA: Diagnosis not present

## 2023-03-10 DIAGNOSIS — M79671 Pain in right foot: Secondary | ICD-10-CM | POA: Diagnosis not present

## 2023-03-13 DIAGNOSIS — R1310 Dysphagia, unspecified: Secondary | ICD-10-CM | POA: Diagnosis not present

## 2023-03-13 DIAGNOSIS — M542 Cervicalgia: Secondary | ICD-10-CM | POA: Diagnosis not present

## 2023-03-14 DIAGNOSIS — M542 Cervicalgia: Secondary | ICD-10-CM | POA: Diagnosis not present

## 2023-03-14 DIAGNOSIS — Z6824 Body mass index (BMI) 24.0-24.9, adult: Secondary | ICD-10-CM | POA: Diagnosis not present

## 2023-03-14 DIAGNOSIS — R21 Rash and other nonspecific skin eruption: Secondary | ICD-10-CM | POA: Diagnosis not present

## 2023-03-14 DIAGNOSIS — M2560 Stiffness of unspecified joint, not elsewhere classified: Secondary | ICD-10-CM | POA: Diagnosis not present

## 2023-03-14 DIAGNOSIS — Z79899 Other long term (current) drug therapy: Secondary | ICD-10-CM | POA: Diagnosis not present

## 2023-03-14 DIAGNOSIS — M2540 Effusion, unspecified joint: Secondary | ICD-10-CM | POA: Diagnosis not present

## 2023-03-17 DIAGNOSIS — M21371 Foot drop, right foot: Secondary | ICD-10-CM | POA: Diagnosis not present

## 2023-03-21 DIAGNOSIS — M21371 Foot drop, right foot: Secondary | ICD-10-CM | POA: Diagnosis not present

## 2023-03-23 ENCOUNTER — Emergency Department (HOSPITAL_COMMUNITY): Payer: Medicare Other

## 2023-03-23 ENCOUNTER — Emergency Department (HOSPITAL_COMMUNITY)
Admission: EM | Admit: 2023-03-23 | Discharge: 2023-03-24 | Disposition: A | Discharge status: Home / Self Care | Payer: Medicare Other | Attending: Emergency Medicine | Admitting: Emergency Medicine

## 2023-03-23 ENCOUNTER — Other Ambulatory Visit: Payer: Self-pay

## 2023-03-23 DIAGNOSIS — R42 Dizziness and giddiness: Secondary | ICD-10-CM | POA: Insufficient documentation

## 2023-03-23 DIAGNOSIS — Z79899 Other long term (current) drug therapy: Secondary | ICD-10-CM | POA: Diagnosis not present

## 2023-03-23 DIAGNOSIS — R112 Nausea with vomiting, unspecified: Secondary | ICD-10-CM | POA: Insufficient documentation

## 2023-03-23 DIAGNOSIS — E876 Hypokalemia: Secondary | ICD-10-CM | POA: Insufficient documentation

## 2023-03-23 DIAGNOSIS — I10 Essential (primary) hypertension: Secondary | ICD-10-CM | POA: Diagnosis not present

## 2023-03-23 DIAGNOSIS — I672 Cerebral atherosclerosis: Secondary | ICD-10-CM | POA: Diagnosis not present

## 2023-03-23 DIAGNOSIS — R1111 Vomiting without nausea: Secondary | ICD-10-CM | POA: Diagnosis not present

## 2023-03-23 DIAGNOSIS — R110 Nausea: Secondary | ICD-10-CM | POA: Diagnosis not present

## 2023-03-23 DIAGNOSIS — I6782 Cerebral ischemia: Secondary | ICD-10-CM | POA: Diagnosis not present

## 2023-03-23 LAB — BASIC METABOLIC PANEL
Anion gap: 11 (ref 5–15)
BUN: 17 mg/dL (ref 8–23)
CO2: 29 mmol/L (ref 22–32)
Calcium: 9.3 mg/dL (ref 8.9–10.3)
Chloride: 97 mmol/L — ABNORMAL LOW (ref 98–111)
Creatinine, Ser: 1.14 mg/dL (ref 0.61–1.24)
GFR, Estimated: >60 mL/min (ref >60)
Glucose, Bld: 153 mg/dL — ABNORMAL HIGH (ref 70–99)
Potassium: 3.1 mmol/L — ABNORMAL LOW (ref 3.5–5.1)
Sodium: 137 mmol/L (ref 135–145)

## 2023-03-23 LAB — CBC
HCT: 44.3 % (ref 39.0–52.0)
Hemoglobin: 15.1 g/dL (ref 13.0–17.0)
MCH: 31.1 pg (ref 26.0–34.0)
MCHC: 34.1 g/dL (ref 30.0–36.0)
MCV: 91.2 fL (ref 80.0–100.0)
Platelets: 308 10*3/uL (ref 150–400)
RBC: 4.86 MIL/uL (ref 4.22–5.81)
RDW: 13.5 % (ref 11.5–15.5)
WBC: 12.9 10*3/uL — ABNORMAL HIGH (ref 4.0–10.5)
nRBC: 0 % (ref 0.0–0.2)

## 2023-03-23 MED ORDER — MECLIZINE HCL 12.5 MG PO TABS: 12.5000 mg | ORAL_TABLET | Freq: Three times a day (TID) | ORAL | 0 refills | Status: DC | PRN

## 2023-03-23 MED ORDER — ONDANSETRON HCL 4 MG/2ML IJ SOLN
4.0000 mg | Freq: Once | INTRAMUSCULAR | Status: AC
  Administered 2023-03-23: 4 mg via INTRAVENOUS
  Filled 2023-03-23: qty 2

## 2023-03-23 MED ORDER — POTASSIUM CHLORIDE 20 MEQ PO PACK: 40.0000 meq | PACK | Freq: Every day | ORAL | Status: DC

## 2023-03-23 MED ORDER — POTASSIUM CHLORIDE 20 MEQ PO PACK
40.0000 meq | PACK | Freq: Once | ORAL | Status: AC
  Administered 2023-03-23: 40 meq via ORAL
  Filled 2023-03-23: qty 2

## 2023-03-23 MED ORDER — MECLIZINE HCL 25 MG PO TABS
25.0000 mg | ORAL_TABLET | Freq: Once | ORAL | Status: DC
  Filled 2023-03-23: qty 1

## 2023-03-23 MED ORDER — ONDANSETRON 8 MG PO TBDP: 8.0000 mg | ORAL_TABLET | Freq: Three times a day (TID) | ORAL | 0 refills | Status: DC | PRN

## 2023-03-23 MED ORDER — SODIUM CHLORIDE 0.9 % IV SOLN
12.5000 mg | Freq: Four times a day (QID) | INTRAVENOUS | Status: DC | PRN
  Administered 2023-03-23: 12.5 mg via INTRAVENOUS
  Filled 2023-03-23: qty 12.5

## 2023-03-23 NOTE — Discharge Instructions (Signed)
Take the medications as prescribed to help with your dizziness.  Follow-up with your primary care doctor to discuss further evaluation with ENT or neurology.  Try doing the Epley maneuver exercises described in your discharge instructions.  Return to the ER as needed for worsening symptoms

## 2023-03-23 NOTE — ED Notes (Addendum)
Pt taken to CT; son-in-law and wife at bedside. Pt reports that nausea has subsided at this time

## 2023-03-23 NOTE — ED Notes (Signed)
Pt unable to swallow antivert and medication cannot be crushed. Provider aware. Pt vomiting again

## 2023-03-23 NOTE — ED Notes (Signed)
Pt able to ambulate without assistance and states he is ready for discharge. Provider aware

## 2023-03-23 NOTE — ED Provider Notes (Signed)
[place] EMERGENCY DEPARTMENT AT [facility] Provider Note   CSN: [id number] Arrival date & time: 03/23/23  2049     History  Chief Complaint  Patient presents with   Dizziness    Samuel Cortez is a 82 y.o. male.   Dizziness    Patient has a history of hypertension and gout.  He states he has had episodes of dizziness in the past but has never been evaluated for them when they occurred.  He did follow-up with his doctor at 1 point and was told if he had another episode to come to the hospital.  Patient states he was at a restaurant this evening when he suddenly started to feel dizzy.  He then became nauseated and had an episode of vomiting.  Patient states whenever he moves his head he feels like the symptoms get worse.  He does not feel lightheaded like he is going to pass out.  He has not had any focal numbness or weakness.  He denies any trouble with his vision or speech.  Home Medications Prior to Admission medications   Medication Sig Start Date End Date Taking? Authorizing Provider  meclizine (ANTIVERT) 12.5 MG tablet Take 1 tablet (12.5 mg total) by mouth 3 (three) times daily as needed for dizziness. 03/23/23  Yes Linwood Dibbles, MD  ondansetron (ZOFRAN-ODT) 8 MG disintegrating tablet Take 1 tablet (8 mg total) by mouth every 8 (eight) hours as needed for nausea or vomiting. 03/23/23  Yes Linwood Dibbles, MD  acetaminophen (TYLENOL) 500 MG tablet Take 500-1,000 mg by mouth every 6 (six) hours as needed (for pain).    [provider]  allopurinol (ZYLOPRIM) 300 MG tablet Take 300 mg by mouth daily.    [provider]  amLODipine (NORVASC) 2.5 MG tablet Take 2.5 mg by mouth daily.    [provider]  bimatoprost (LUMIGAN) 0.01 % SOLN Place 1 drop into both eyes at bedtime.    [provider]  methocarbamol (ROBAXIN) 500 MG tablet Take 1 tablet (500 mg total) by mouth every 6 (six) hours as needed for muscle spasms. 09/01/18   Julio Sicks, MD   oxyCODONE (OXY IR/ROXICODONE) 5 MG immediate release tablet Take 1 tablet (5 mg total) by mouth every 3 (three) hours as needed for moderate pain ((score 4 to 6)). 09/01/18   Julio Sicks, MD  predniSONE (DELTASONE) 20 MG tablet 20 mg 3 a day for 4 days 2 a day for 3 days 1 for 4 days 04/12/22   Janalyn Harder, MD  Propylene Glycol (SYSTANE BALANCE) 0.6 % SOLN Place 2 drops into both eyes as needed (for itching).    [provider]  telmisartan-hydrochlorothiazide (MICARDIS HCT) 40-12.5 MG tablet Take 1 tablet by mouth daily.    [provider]      Allergies    Pollen extract and Adhesive [tape]    Review of Systems   Review of Systems  Neurological:  Positive for dizziness.    Physical Exam Updated Vital Signs BP 129/70   Pulse 70   Temp (!) 97.4 F (36.3 C) (Oral)   Resp 15   Ht 1.753 m (5\' 9" )   Wt 74.8 kg   SpO2 98%   BMI 24.37 kg/m  Physical Exam Vitals and nursing note reviewed.  Constitutional:      General: He is not in acute distress.    Appearance: He is well-developed.  HENT:     Head: Normocephalic and atraumatic.  Right Ear: External ear normal.     Left Ear: External ear normal.  Eyes:     General: No visual field deficit or scleral icterus.       Right eye: No discharge.        Left eye: No discharge.     Conjunctiva/sclera: Conjunctivae normal.  Neck:     Trachea: No tracheal deviation.  Cardiovascular:     Rate and Rhythm: Normal rate and regular rhythm.  Pulmonary:     Effort: Pulmonary effort is normal. No respiratory distress.     Breath sounds: Normal breath sounds. No stridor. No wheezing or rales.  Abdominal:     General: Bowel sounds are normal. There is no distension.     Palpations: Abdomen is soft.     Tenderness: There is no abdominal tenderness. There is no guarding or rebound.  Musculoskeletal:        General: No tenderness.     Cervical back: Neck supple.  Skin:    General: Skin is warm and dry.     Findings:  No rash.  Neurological:     Mental Status: He is alert and oriented to person, place, and time.     Cranial Nerves: No cranial nerve deficit, dysarthria or facial asymmetry.     Sensory: No sensory deficit.     Motor: No abnormal muscle tone, seizure activity or pronator drift.     Coordination: Coordination normal.     Comments:  able to hold both legs off bed for 5 seconds, sensation intact in all extremities,  no left or right sided neglect, normal finger-nose exam bilaterally, few beats of horizontal nystagmus noted   Psychiatric:        Mood and Affect: Mood normal.     ED Results / Procedures / Treatments   Labs (all labs ordered are listed, but only abnormal results are displayed) Labs Reviewed  BASIC METABOLIC PANEL - Abnormal; Notable for the following components:      Result Value   Potassium 3.1 (*)    Chloride 97 (*)    Glucose, Bld 153 (*)    All other components within normal limits  CBC - Abnormal; Notable for the following components:   WBC 12.9 (*)    All other components within normal limits    EKG EKG Interpretation Date/Time:  Thursday March 23 2023 21:00:08 EDT Ventricular Rate:  71 PR Interval:  194 QRS Duration:  141 QT Interval:  466 QTC Calculation: 507 R Axis:   -66  Text Interpretation: Sinus rhythm Ventricular premature complex , new since last tracing Right atrial enlargement RBBB and LAFB Confirmed by Linwood Dibbles [id number]) on 03/23/2023 9:07:31 PM  Radiology CT Head Wo Contrast  Result Date: 03/23/2023 CLINICAL DATA:  Sudden onset dizziness and nausea EXAM: CT HEAD WITHOUT CONTRAST TECHNIQUE: Contiguous axial images were obtained from the base of the skull through the vertex without intravenous contrast. RADIATION DOSE REDUCTION: This exam was performed according to the departmental dose-optimization program which includes automated exposure control, adjustment of the mA and/or kV according to patient size and/or use of iterative reconstruction  technique. COMPARISON:  None Available. FINDINGS: Brain: No intracranial hemorrhage, mass effect, or evidence of acute infarct. No hydrocephalus. No extra-axial fluid collection. Generalized cerebral atrophy. Ill-defined hypoattenuation within the cerebral white matter is nonspecific but consistent with chronic small vessel ischemic disease. Vascular: No hyperdense vessel. Intracranial arterial calcification. Skull: No fracture or focal lesion. Sinuses/Orbits: No acute finding. Paranasal sinuses and mastoid  air cells are well aerated. Other: None. IMPRESSION: No acute intracranial abnormality. Age commensurate cerebral atrophy and chronic microvascular ischemic change. Electronically Signed   By: Minerva Fester M.D.   On: 03/23/2023 23:06    Procedures Procedures    Medications Ordered in ED Medications  meclizine (ANTIVERT) tablet 25 mg (25 mg Oral Not Given 03/23/23 2143)  promethazine (PHENERGAN) 12.5 mg in sodium chloride 0.9 % 50 mL IVPB (0 mg Intravenous Stopped 03/23/23 2246)  ondansetron (ZOFRAN) injection 4 mg (4 mg Intravenous Given 03/23/23 2143)  potassium chloride (KLOR-CON) packet 40 mEq (40 mEq Oral Given 03/23/23 2337)    ED Course/ Medical Decision Making/ A&P Clinical Course as of 03/23/23 2338  Thu Mar 23, 2023  2201 Patient still vomiting despite Zofran.  Will give a dose of Phenergan [JK]  2317 Head CT without acute abnormality [JK]    Clinical Course User Index [JK] Linwood Dibbles, MD                             Medical Decision Making Problems Addressed: Hypokalemia: acute illness or injury Vertigo: acute illness or injury that poses a threat to life or bodily functions  Amount and/or Complexity of Data Reviewed Labs: ordered. Decision-making details documented in ED Course. Radiology: ordered and independent interpretation performed.  Risk Prescription drug management.   Patient presented to the ED for evaluation of dizziness.  Patient reports having some symptoms  similar to this recently.  He has had some issues waking up at night when he rolls over having acute dizziness.  Head CT does not show any acute abnormalities patient is neurologic exam is reassuring not suggestive of any acute stroke.  I suspect his vertigo is peripheral in nature and not central.  Patient symptoms improved with treatment in the ED.  Will have him ambulate to make sure he is safe for discharge.  Discussed outpatient follow-up with possibly neurology to        Final Clinical Impression(s) / ED Diagnoses Final diagnoses:  Vertigo  Hypokalemia    Rx / DC Orders ED Discharge Orders          Ordered    ondansetron (ZOFRAN-ODT) 8 MG disintegrating tablet  Every 8 hours PRN        03/23/23 2335    meclizine (ANTIVERT) 12.5 MG tablet  3 times daily PRN        03/23/23 2335              Linwood Dibbles, MD 03/23/23 2338

## 2023-03-23 NOTE — ED Triage Notes (Signed)
Pt was at [employer] waiting to be seated when he had sudden onset of dizziness and nausea and went to restroom and vomited large amount. Pt reports previous episodes that were similar with full work ups and no definitive findings. Dizziness worse with head movement.

## 2023-03-29 DIAGNOSIS — L723 Sebaceous cyst: Secondary | ICD-10-CM | POA: Diagnosis not present

## 2023-03-29 DIAGNOSIS — D485 Neoplasm of uncertain behavior of skin: Secondary | ICD-10-CM | POA: Diagnosis not present

## 2023-03-29 DIAGNOSIS — L986 Other infiltrative disorders of the skin and subcutaneous tissue: Secondary | ICD-10-CM | POA: Diagnosis not present

## 2023-03-29 DIAGNOSIS — L820 Inflamed seborrheic keratosis: Secondary | ICD-10-CM | POA: Diagnosis not present

## 2023-03-29 DIAGNOSIS — L570 Actinic keratosis: Secondary | ICD-10-CM | POA: Diagnosis not present

## 2023-03-29 DIAGNOSIS — L111 Transient acantholytic dermatosis [Grover]: Secondary | ICD-10-CM | POA: Diagnosis not present

## 2023-03-30 ENCOUNTER — Telehealth: Payer: Self-pay

## 2023-03-30 DIAGNOSIS — M21371 Foot drop, right foot: Secondary | ICD-10-CM | POA: Diagnosis not present

## 2023-03-30 NOTE — Telephone Encounter (Signed)
Transition Care Management Follow-up Telephone Call Date of discharge and from where: 03/24/2023 [facility] How have you been since you were released from the hospital? Patient is feeling much better. Any questions or concerns? No  Items Reviewed: Did the pt receive and understand the discharge instructions provided? Yes  Medications obtained and verified? Yes  Other? No  Any new allergies since your discharge? No  Dietary orders reviewed? Yes Do you have support at home? Yes   Follow up appointments reviewed:  PCP Hospital f/u appt confirmed? No  Scheduled to see  on  @ . Specialist Hospital f/u appt confirmed? No  Scheduled to see  on  @ . Are transportation arrangements needed? No  If their condition worsens, is the pt aware to call PCP or go to the Emergency Dept.? Yes Was the patient provided with contact information for the PCP's office or ED? Yes Was to pt encouraged to call back with questions or concerns? Yes  Samuel Cortez [facility]  [facility] Population Health Community Resource Care Guide   [email or web address]  ?? [phone]   Website: [email or web address]  [email or web address]

## 2023-04-03 DIAGNOSIS — M21371 Foot drop, right foot: Secondary | ICD-10-CM | POA: Diagnosis not present

## 2023-04-05 DIAGNOSIS — M21371 Foot drop, right foot: Secondary | ICD-10-CM | POA: Diagnosis not present

## 2023-04-06 DIAGNOSIS — T1490XD Injury, unspecified, subsequent encounter: Secondary | ICD-10-CM | POA: Diagnosis not present

## 2023-04-06 DIAGNOSIS — L820 Inflamed seborrheic keratosis: Secondary | ICD-10-CM | POA: Diagnosis not present

## 2023-04-06 DIAGNOSIS — L259 Unspecified contact dermatitis, unspecified cause: Secondary | ICD-10-CM | POA: Diagnosis not present

## 2023-04-11 DIAGNOSIS — M21371 Foot drop, right foot: Secondary | ICD-10-CM | POA: Diagnosis not present

## 2023-04-13 DIAGNOSIS — M21371 Foot drop, right foot: Secondary | ICD-10-CM | POA: Diagnosis not present

## 2023-04-19 DIAGNOSIS — H0102A Squamous blepharitis right eye, upper and lower eyelids: Secondary | ICD-10-CM | POA: Diagnosis not present

## 2023-04-19 DIAGNOSIS — H401131 Primary open-angle glaucoma, bilateral, mild stage: Secondary | ICD-10-CM | POA: Diagnosis not present

## 2023-04-27 DIAGNOSIS — M21371 Foot drop, right foot: Secondary | ICD-10-CM | POA: Diagnosis not present

## 2023-04-27 DIAGNOSIS — E785 Hyperlipidemia, unspecified: Secondary | ICD-10-CM | POA: Diagnosis not present

## 2023-04-27 DIAGNOSIS — E669 Obesity, unspecified: Secondary | ICD-10-CM | POA: Diagnosis not present

## 2023-04-27 DIAGNOSIS — I700 Atherosclerosis of aorta: Secondary | ICD-10-CM | POA: Diagnosis not present

## 2023-04-27 DIAGNOSIS — I129 Hypertensive chronic kidney disease with stage 1 through stage 4 chronic kidney disease, or unspecified chronic kidney disease: Secondary | ICD-10-CM | POA: Diagnosis not present

## 2023-04-27 DIAGNOSIS — M1120 Other chondrocalcinosis, unspecified site: Secondary | ICD-10-CM | POA: Diagnosis not present

## 2023-04-27 DIAGNOSIS — K219 Gastro-esophageal reflux disease without esophagitis: Secondary | ICD-10-CM | POA: Diagnosis not present

## 2023-04-27 DIAGNOSIS — M1990 Unspecified osteoarthritis, unspecified site: Secondary | ICD-10-CM | POA: Diagnosis not present

## 2023-04-27 DIAGNOSIS — N183 Chronic kidney disease, stage 3 unspecified: Secondary | ICD-10-CM | POA: Diagnosis not present

## 2023-04-27 DIAGNOSIS — R634 Abnormal weight loss: Secondary | ICD-10-CM | POA: Diagnosis not present

## 2023-04-27 DIAGNOSIS — Z981 Arthrodesis status: Secondary | ICD-10-CM | POA: Diagnosis not present

## 2023-04-27 DIAGNOSIS — R070 Pain in throat: Secondary | ICD-10-CM | POA: Diagnosis not present

## 2023-05-16 DIAGNOSIS — S40811A Abrasion of right upper arm, initial encounter: Secondary | ICD-10-CM | POA: Diagnosis not present

## 2023-05-16 DIAGNOSIS — L111 Transient acantholytic dermatosis [Grover]: Secondary | ICD-10-CM | POA: Diagnosis not present

## 2023-05-25 DIAGNOSIS — G5731 Lesion of lateral popliteal nerve, right lower limb: Secondary | ICD-10-CM | POA: Diagnosis not present

## 2023-06-01 DIAGNOSIS — M9903 Segmental and somatic dysfunction of lumbar region: Secondary | ICD-10-CM | POA: Diagnosis not present

## 2023-06-01 DIAGNOSIS — H903 Sensorineural hearing loss, bilateral: Secondary | ICD-10-CM | POA: Diagnosis not present

## 2023-06-01 DIAGNOSIS — H838X3 Other specified diseases of inner ear, bilateral: Secondary | ICD-10-CM | POA: Diagnosis not present

## 2023-06-01 DIAGNOSIS — M5136 Other intervertebral disc degeneration, lumbar region: Secondary | ICD-10-CM | POA: Diagnosis not present

## 2023-06-01 DIAGNOSIS — M25551 Pain in right hip: Secondary | ICD-10-CM | POA: Diagnosis not present

## 2023-06-01 DIAGNOSIS — M9904 Segmental and somatic dysfunction of sacral region: Secondary | ICD-10-CM | POA: Diagnosis not present

## 2023-06-02 ENCOUNTER — Other Ambulatory Visit: Payer: Self-pay | Admitting: Otolaryngology

## 2023-06-02 DIAGNOSIS — H9042 Sensorineural hearing loss, unilateral, left ear, with unrestricted hearing on the contralateral side: Secondary | ICD-10-CM

## 2023-06-09 DIAGNOSIS — M21371 Foot drop, right foot: Secondary | ICD-10-CM | POA: Diagnosis not present

## 2023-06-16 ENCOUNTER — Other Ambulatory Visit: Payer: Self-pay | Admitting: Otolaryngology

## 2023-06-16 ENCOUNTER — Ambulatory Visit
Admission: RE | Admit: 2023-06-16 | Discharge: 2023-06-16 | Disposition: A | Discharge status: Home / Self Care | Payer: Medicare Other | Source: Ambulatory Visit | Attending: Otolaryngology | Admitting: Otolaryngology

## 2023-06-16 DIAGNOSIS — H9042 Sensorineural hearing loss, unilateral, left ear, with unrestricted hearing on the contralateral side: Secondary | ICD-10-CM

## 2023-06-27 DIAGNOSIS — M79604 Pain in right leg: Secondary | ICD-10-CM | POA: Diagnosis not present

## 2023-06-29 ENCOUNTER — Ambulatory Visit
Admission: RE | Admit: 2023-06-29 | Discharge: 2023-06-29 | Disposition: A | Discharge status: Home / Self Care | Payer: Medicare Other | Source: Ambulatory Visit | Attending: Otolaryngology | Admitting: Otolaryngology

## 2023-06-29 DIAGNOSIS — H9202 Otalgia, left ear: Secondary | ICD-10-CM | POA: Diagnosis not present

## 2023-06-29 DIAGNOSIS — G319 Degenerative disease of nervous system, unspecified: Secondary | ICD-10-CM | POA: Diagnosis not present

## 2023-07-17 ENCOUNTER — Telehealth (INDEPENDENT_AMBULATORY_CARE_PROVIDER_SITE_OTHER): Payer: Self-pay | Admitting: Otolaryngology

## 2023-07-17 NOTE — Telephone Encounter (Signed)
Spoke with patient regarding to his Brain MRI. Per Dr.Teoh, I told him that showed no mass. He will f/u in one year. Sooner if he has any issues.

## 2023-08-07 DIAGNOSIS — M21371 Foot drop, right foot: Secondary | ICD-10-CM | POA: Diagnosis not present

## 2023-08-07 DIAGNOSIS — M1990 Unspecified osteoarthritis, unspecified site: Secondary | ICD-10-CM | POA: Diagnosis not present

## 2023-08-07 DIAGNOSIS — I129 Hypertensive chronic kidney disease with stage 1 through stage 4 chronic kidney disease, or unspecified chronic kidney disease: Secondary | ICD-10-CM | POA: Diagnosis not present

## 2023-08-07 DIAGNOSIS — R634 Abnormal weight loss: Secondary | ICD-10-CM | POA: Diagnosis not present

## 2023-08-07 DIAGNOSIS — I700 Atherosclerosis of aorta: Secondary | ICD-10-CM | POA: Diagnosis not present

## 2023-08-07 DIAGNOSIS — N183 Chronic kidney disease, stage 3 unspecified: Secondary | ICD-10-CM | POA: Diagnosis not present

## 2023-08-07 DIAGNOSIS — N401 Enlarged prostate with lower urinary tract symptoms: Secondary | ICD-10-CM | POA: Diagnosis not present

## 2023-08-07 DIAGNOSIS — Z981 Arthrodesis status: Secondary | ICD-10-CM | POA: Diagnosis not present

## 2023-08-07 DIAGNOSIS — G319 Degenerative disease of nervous system, unspecified: Secondary | ICD-10-CM | POA: Diagnosis not present

## 2023-08-07 DIAGNOSIS — E785 Hyperlipidemia, unspecified: Secondary | ICD-10-CM | POA: Diagnosis not present

## 2023-08-07 DIAGNOSIS — M109 Gout, unspecified: Secondary | ICD-10-CM | POA: Diagnosis not present

## 2023-08-07 DIAGNOSIS — R070 Pain in throat: Secondary | ICD-10-CM | POA: Diagnosis not present

## 2023-08-10 ENCOUNTER — Other Ambulatory Visit: Payer: Self-pay | Admitting: Internal Medicine

## 2023-08-10 DIAGNOSIS — R634 Abnormal weight loss: Secondary | ICD-10-CM

## 2023-08-21 DIAGNOSIS — H43813 Vitreous degeneration, bilateral: Secondary | ICD-10-CM | POA: Diagnosis not present

## 2023-08-21 DIAGNOSIS — H0102A Squamous blepharitis right eye, upper and lower eyelids: Secondary | ICD-10-CM | POA: Diagnosis not present

## 2023-08-21 DIAGNOSIS — H401131 Primary open-angle glaucoma, bilateral, mild stage: Secondary | ICD-10-CM | POA: Diagnosis not present

## 2023-08-21 DIAGNOSIS — H353131 Nonexudative age-related macular degeneration, bilateral, early dry stage: Secondary | ICD-10-CM | POA: Diagnosis not present

## 2023-08-23 ENCOUNTER — Other Ambulatory Visit: Payer: Self-pay | Admitting: Internal Medicine

## 2023-08-23 ENCOUNTER — Ambulatory Visit
Admission: RE | Admit: 2023-08-23 | Discharge: 2023-08-23 | Disposition: A | Discharge status: Home / Self Care | Payer: Medicare Other | Source: Ambulatory Visit | Attending: Internal Medicine | Admitting: Internal Medicine

## 2023-08-23 DIAGNOSIS — R634 Abnormal weight loss: Secondary | ICD-10-CM

## 2023-08-23 DIAGNOSIS — K8020 Calculus of gallbladder without cholecystitis without obstruction: Secondary | ICD-10-CM | POA: Diagnosis not present

## 2023-08-23 DIAGNOSIS — I700 Atherosclerosis of aorta: Secondary | ICD-10-CM | POA: Diagnosis not present

## 2023-08-23 DIAGNOSIS — I2510 Atherosclerotic heart disease of native coronary artery without angina pectoris: Secondary | ICD-10-CM | POA: Diagnosis not present

## 2023-08-23 MED ORDER — IOPAMIDOL (ISOVUE-300) INJECTION 61%: 100.0000 mL | Freq: Once | INTRAVENOUS | Status: DC | PRN

## 2023-10-11 ENCOUNTER — Encounter: Payer: Self-pay | Admitting: Diagnostic Neuroimaging

## 2023-10-11 ENCOUNTER — Encounter: Payer: Self-pay | Admitting: *Deleted

## 2023-10-11 ENCOUNTER — Ambulatory Visit (INDEPENDENT_AMBULATORY_CARE_PROVIDER_SITE_OTHER): Payer: Medicare Other | Admitting: Diagnostic Neuroimaging

## 2023-10-11 VITALS — BP 113/68 | HR 75 | Ht 69.0 in | Wt 151.0 lb

## 2023-10-11 DIAGNOSIS — R471 Dysarthria and anarthria: Secondary | ICD-10-CM

## 2023-10-11 DIAGNOSIS — R634 Abnormal weight loss: Secondary | ICD-10-CM | POA: Diagnosis not present

## 2023-10-11 DIAGNOSIS — R29898 Other symptoms and signs involving the musculoskeletal system: Secondary | ICD-10-CM | POA: Diagnosis not present

## 2023-10-11 DIAGNOSIS — R799 Abnormal finding of blood chemistry, unspecified: Secondary | ICD-10-CM

## 2023-10-11 DIAGNOSIS — R1310 Dysphagia, unspecified: Secondary | ICD-10-CM | POA: Diagnosis not present

## 2023-10-11 NOTE — Patient Instructions (Signed)
BILATERAL FOOT DROP (since summer 2024, RIGHT WORSE THAN LEFT), UNINTENTIONAL WEIGHT LOSS (-50lbs in 1 year), DYSPHAGIA (since 2019), DYSARTHRIA (since 2019) - check EMG/NCS (RUE, BLE); rule out CIDP, MND, or other diffuse neuromuscular / neuropathy - check labs (neuropathy, myasthenia) - follow up with PCP and GI for unintentional weight loss evaluation

## 2023-10-11 NOTE — Progress Notes (Signed)
[facility]  PATIENT: RUBY WHEELHOUSE DOB: [birth year]  REFERRING CLINICIAN: Yolonda Kida, * HISTORY FROM: patient  REASON FOR VISIT: new consult   HISTORICAL  CHIEF COMPLAINT:  Chief Complaint  Patient presents with   Numbness    Rm 6 alone Pt is well, reports about 6 months ago he had R drop foot. He started having swelling, numbness and occasional tingling in R foot.     HISTORY OF PRESENT ILLNESS:   83 year old male here for evaluation of right foot drop weakness.  For past 1 year patient has had unintentional 50 to 60 pound weight loss.  He has been to PCP and GI but no causes found.  Around 6 months ago patient noticed weakness in the right foot.  He went to orthopedic clinic and was diagnosed with right foot drop.  EMG nerve conduction study confirmed right common peroneal neuropathy at the fibular head.  Left lower extremity was not tested.  Over the past 1 to 2 months, he has been using an AFO brace, and his right foot drop weakness has subjectively improved.  Has some mild numbness in the right foot.  Denies any problems in hands or left leg.  Has had 3+ years of dysphagia and dysarthria.  Has history of cervical spine surgery in 2019.    REVIEW OF SYSTEMS: Full 14 system review of systems performed and negative with exception of: AS PER hpi.  ALLERGIES: Allergies  Allergen Reactions   Pollen Extract Other (See Comments)    Lightheadedness, runny nose and itchy eyes (trees and shrubs, also)   Adhesive [Tape] Rash    HOME MEDICATIONS: Outpatient Medications Prior to Visit  Medication Sig Dispense Refill   amLODipine (NORVASC) 2.5 MG tablet Take 2.5 mg by mouth daily.     bimatoprost (LUMIGAN) 0.01 % SOLN Place 1 drop into both eyes at bedtime.     loratadine (CLARITIN) 10 MG tablet Take 10 mg by mouth daily.     acetaminophen (TYLENOL) 500 MG tablet Take 500-1,000 mg by mouth every 6 (six) hours as needed (for pain). (Patient  not taking: Reported on 10/11/2023)     allopurinol (ZYLOPRIM) 300 MG tablet Take 300 mg by mouth daily. (Patient not taking: Reported on 10/11/2023)     meclizine (ANTIVERT) 12.5 MG tablet Take 1 tablet (12.5 mg total) by mouth 3 (three) times daily as needed for dizziness. (Patient not taking: Reported on 10/11/2023) 21 tablet 0   methocarbamol (ROBAXIN) 500 MG tablet Take 1 tablet (500 mg total) by mouth every 6 (six) hours as needed for muscle spasms. (Patient not taking: Reported on 10/11/2023) 30 tablet 1   ondansetron (ZOFRAN-ODT) 8 MG disintegrating tablet Take 1 tablet (8 mg total) by mouth every 8 (eight) hours as needed for nausea or vomiting. (Patient not taking: Reported on 10/11/2023) 12 tablet 0   oxyCODONE (OXY IR/ROXICODONE) 5 MG immediate release tablet Take 1 tablet (5 mg total) by mouth every 3 (three) hours as needed for moderate pain ((score 4 to 6)). (Patient not taking: Reported on 10/11/2023) 30 tablet 0   predniSONE (DELTASONE) 20 MG tablet 20 mg 3 a day for 4 days 2 a day for 3 days 1 for 4 days (Patient not taking: Reported on 10/11/2023) 24 tablet 0   Propylene Glycol (SYSTANE BALANCE) 0.6 % SOLN Place 2 drops into both eyes as needed (for itching). (Patient not taking: Reported on 10/11/2023)     telmisartan-hydrochlorothiazide (MICARDIS HCT) 40-12.5 MG tablet Take  1 tablet by mouth daily. (Patient not taking: Reported on 10/11/2023)     No facility-administered medications prior to visit.    PAST MEDICAL HISTORY: Past Medical History:  Diagnosis Date   Gout    Hypertension     PAST SURGICAL HISTORY: Past Surgical History:  Procedure Laterality Date   ANTERIOR CERVICAL DECOMP/DISCECTOMY FUSION N/A 08/31/2018   Procedure: ANTERIOR CERVICAL DECOMPRESSION/DISCECTOMY FUSION CERVICAL FOUR-CERVICAL FIVE;  Surgeon: Maeola Harman, MD;  Location: [facility] OR;  Service: Neurosurgery;  Laterality: N/A;  ANTERIOR CERVICAL DECOMPRESSION/DISCECTOMY FUSION CERVICAL FOUR-CERVICAL FIVE    TONSILLECTOMY      FAMILY HISTORY: History reviewed. No pertinent family history.  SOCIAL HISTORY: Social History   Socioeconomic History   Marital status: Married    Spouse name: Not on file   Number of children: Not on file   Years of education: Not on file   Highest education level: Not on file  Occupational History   Not on file  Tobacco Use   Smoking status: Former   Smokeless tobacco: Never  Vaping Use   Vaping status: Never Used  Substance and Sexual Activity   Alcohol use: Yes    Alcohol/week: 2.0 standard drinks of alcohol    Types: 1 Shots of liquor, 1 Glasses of wine per week   Drug use: Never   Sexual activity: Not on file  Other Topics Concern   Not on file  Social History Narrative   Not on file   Social Drivers of Health   Financial Resource Strain: Not on file  Food Insecurity: Not on file  Transportation Needs: Not on file  Physical Activity: Not on file  Stress: Not on file  Social Connections: Not on file  Intimate Partner Violence: Not on file     PHYSICAL EXAM  GENERAL EXAM/CONSTITUTIONAL: Vitals:  Vitals:   10/11/23 1017  BP: 113/68  Pulse: 75  Weight: 151 lb (68.5 kg)  Height: 5\' 9"  (1.753 m)   Body mass index is 22.3 kg/m. Wt Readings from Last 3 Encounters:  10/11/23 151 lb (68.5 kg)  03/23/23 165 lb (74.8 kg)  04/12/22 170 lb (77.1 kg)   Patient is in no distress; well developed, nourished and groomed; neck is supple  CARDIOVASCULAR: Examination of carotid arteries is normal; no carotid bruits Regular rate and rhythm, no murmurs Examination of peripheral vascular system by observation and palpation is normal  EYES: Ophthalmoscopic exam of optic discs and posterior segments is normal; no papilledema or hemorrhages No results found.  MUSCULOSKELETAL: Gait, strength, tone, movements noted in Neurologic exam below  NEUROLOGIC: MENTAL STATUS:      No data to display         awake, alert, oriented to person,  place and time recent and remote memory intact normal attention and concentration language fluent, comprehension intact, naming intact fund of knowledge appropriate  CRANIAL NERVE:  2nd - no papilledema on fundoscopic exam 2nd, 3rd, 4th, 6th - pupils equal and reactive to light, visual fields full to confrontation, extraocular muscles intact, no nystagmus 5th - facial sensation symmetric 7th - facial strength symmetric 8th - hearing intact 9th - palate elevates symmetrically, uvula midline 11th - shoulder shrug symmetric 12th - tongue protrusion midline  MOTOR:  normal bulk and tone, full strength in the BUE, BLE EXCEPT RIGHT FOOT DF 3+, LEFT FOOT DF 4  SENSORY:  normal and symmetric to light touch, pinprick, temperature, vibration  COORDINATION:  finger-nose-finger, fine finger movements normal  REFLEXES:  deep tendon reflexes  TRACE and symmetric  GAIT/STATION:  narrow based gait; SLOW, CAUTIOUS GAIT; DIFF WITH TOE AND HEEL GAIT     DIAGNOSTIC DATA (LABS, IMAGING, TESTING) - I reviewed patient records, labs, notes, testing and imaging myself where available.  Lab Results  Component Value Date   WBC 12.9 (H) 03/23/2023   HGB 15.1 03/23/2023   HCT 44.3 03/23/2023   MCV 91.2 03/23/2023   PLT 308 03/23/2023      Component Value Date/Time   NA 137 03/23/2023 2142   K 3.1 (L) 03/23/2023 2142   CL 97 (L) 03/23/2023 2142   CO2 29 03/23/2023 2142   GLUCOSE 153 (H) 03/23/2023 2142   BUN 17 03/23/2023 2142   CREATININE 1.14 03/23/2023 2142   CALCIUM 9.3 03/23/2023 2142   GFRNONAA >60 03/23/2023 2142   GFRAA 51 (L) 08/30/2018 1912   No results found for: "CHOL", "HDL", "LDLCALC", "LDLDIRECT", "TRIG", "CHOLHDL" No results found for: "HGBA1C" No results found for: "VITAMINB12" No results found for: "TSH"    ASSESSMENT AND PLAN  83 y.o. year old male here with:   Dx:  1. Weakness of both lower extremities   2. Unintentional weight loss   3. Dysphagia,  unspecified type   4. Dysarthria   5. Abnormal finding of blood chemistry, unspecified     PLAN:  BILATERAL FOOT DROP (since summer 2024, RIGHT WORSE THAN LEFT), UNINTENTIONAL WEIGHT LOSS (-50lbs in 1 year), DYSPHAGIA (since 2019), DYSARTHRIA (since 2019) - check EMG/NCS (RUE, BLE); rule out CIDP, MND, or other diffuse neuromuscular / neuropathy condition - check labs (neuropathy, myasthenia) - follow up with PCP and GI for unintentional weight loss evaluation  Orders Placed This Encounter  Procedures   Hemoglobin A1c   Vitamin B12   Multiple Myeloma Panel (SPEP&IFE w/QIG)   AChR Abs with Reflex to MuSK   NCV with EMG(electromyography)   Return for for NCV/EMG.    Suanne Marker, MD 10/11/2023, 11:44 AM Certified in Neurology, Neurophysiology and Neuroimaging  [facility] [address], [address] [place], [place] [address] [phone]

## 2023-10-16 DIAGNOSIS — E785 Hyperlipidemia, unspecified: Secondary | ICD-10-CM | POA: Diagnosis not present

## 2023-10-16 DIAGNOSIS — G319 Degenerative disease of nervous system, unspecified: Secondary | ICD-10-CM | POA: Diagnosis not present

## 2023-10-16 DIAGNOSIS — R634 Abnormal weight loss: Secondary | ICD-10-CM | POA: Diagnosis not present

## 2023-10-16 DIAGNOSIS — N183 Chronic kidney disease, stage 3 unspecified: Secondary | ICD-10-CM | POA: Diagnosis not present

## 2023-10-16 DIAGNOSIS — I129 Hypertensive chronic kidney disease with stage 1 through stage 4 chronic kidney disease, or unspecified chronic kidney disease: Secondary | ICD-10-CM | POA: Diagnosis not present

## 2023-10-16 DIAGNOSIS — Z981 Arthrodesis status: Secondary | ICD-10-CM | POA: Diagnosis not present

## 2023-10-16 DIAGNOSIS — H409 Unspecified glaucoma: Secondary | ICD-10-CM | POA: Diagnosis not present

## 2023-10-16 DIAGNOSIS — M1120 Other chondrocalcinosis, unspecified site: Secondary | ICD-10-CM | POA: Diagnosis not present

## 2023-10-16 DIAGNOSIS — R070 Pain in throat: Secondary | ICD-10-CM | POA: Diagnosis not present

## 2023-10-16 DIAGNOSIS — I700 Atherosclerosis of aorta: Secondary | ICD-10-CM | POA: Diagnosis not present

## 2023-10-16 DIAGNOSIS — L309 Dermatitis, unspecified: Secondary | ICD-10-CM | POA: Diagnosis not present

## 2023-10-16 DIAGNOSIS — M21371 Foot drop, right foot: Secondary | ICD-10-CM | POA: Diagnosis not present

## 2023-10-21 LAB — MULTIPLE MYELOMA PANEL, SERUM
Albumin SerPl Elph-Mcnc: 3.9 g/dL (ref 2.9–4.4)
Albumin/Glob SerPl: 1.5 (ref 0.7–1.7)
Alpha 1: 0.3 g/dL (ref 0.0–0.4)
Alpha2 Glob SerPl Elph-Mcnc: 0.7 g/dL (ref 0.4–1.0)
B-Globulin SerPl Elph-Mcnc: 0.9 g/dL (ref 0.7–1.3)
Gamma Glob SerPl Elph-Mcnc: 0.7 g/dL (ref 0.4–1.8)
Globulin, Total: 2.7 g/dL (ref 2.2–3.9)
IgA/Immunoglobulin A, Serum: 238 mg/dL (ref 61–437)
IgG (Immunoglobin G), Serum: 751 mg/dL (ref 603–1613)
IgM (Immunoglobulin M), Srm: 36 mg/dL (ref 15–143)
Total Protein: 6.6 g/dL (ref 6.0–8.5)

## 2023-10-21 LAB — HEMOGLOBIN A1C
Est. average glucose Bld gHb Est-mCnc: 114 mg/dL
Hgb A1c MFr Bld: 5.6 % (ref 4.8–5.6)

## 2023-10-21 LAB — ACHR ABS WITH REFLEX TO MUSK: AChR Binding Ab, Serum: <0.03 nmol/L (ref 0.00–0.24)

## 2023-10-21 LAB — VITAMIN B12: Vitamin B-12: 378 pg/mL (ref 232–1245)

## 2023-10-21 LAB — MUSK ANTIBODIES: MuSK Antibodies: <1 U/mL

## 2023-10-26 ENCOUNTER — Telehealth: Payer: Self-pay | Admitting: *Deleted

## 2023-10-26 NOTE — Telephone Encounter (Signed)
-----   Message from [employer] sent at 10/26/2023  1:04 PM EST ----- Normal labs. Please call patient. -VRP

## 2023-10-26 NOTE — Telephone Encounter (Signed)
 Spoke to  pt gave labwork results pt expressed understanding and thanked me for calling

## 2023-11-02 ENCOUNTER — Ambulatory Visit (INDEPENDENT_AMBULATORY_CARE_PROVIDER_SITE_OTHER): Payer: Medicare Other | Admitting: Diagnostic Neuroimaging

## 2023-11-02 ENCOUNTER — Ambulatory Visit (INDEPENDENT_AMBULATORY_CARE_PROVIDER_SITE_OTHER): Payer: Self-pay | Admitting: Diagnostic Neuroimaging

## 2023-11-02 DIAGNOSIS — R29898 Other symptoms and signs involving the musculoskeletal system: Secondary | ICD-10-CM

## 2023-11-02 DIAGNOSIS — R634 Abnormal weight loss: Secondary | ICD-10-CM | POA: Diagnosis not present

## 2023-11-02 DIAGNOSIS — R1310 Dysphagia, unspecified: Secondary | ICD-10-CM | POA: Diagnosis not present

## 2023-11-02 DIAGNOSIS — Z0289 Encounter for other administrative examinations: Secondary | ICD-10-CM

## 2023-11-02 DIAGNOSIS — G729 Myopathy, unspecified: Secondary | ICD-10-CM

## 2023-11-02 DIAGNOSIS — R471 Dysarthria and anarthria: Secondary | ICD-10-CM

## 2023-11-02 NOTE — Progress Notes (Signed)
  EMG/NCS shows widespread severe axonal sensorimotor polyneuropathy.  Myopathic changes on needle EMG in the right upper extremity and right vastus medialis were noted.  Chronic neurogenic changes noted in the bilateral tibialis anterior muscles.  This is occurred in the setting of intentional weight loss, unclear whether this was related to dysphagia leading to decreased calorie intake versus malabsorption or other muscle wasting disease.  Will check myopathy lab testing, then consider referral for left deltoid muscle biopsy.  Advised patient to start food diary for 1 month, with daily body weight measurements, and then consider referral to nutritionist.  Orders Placed This Encounter  Procedures   Anti-cN-1A (NT5c1A) [employer] (RDL)   MyoMarker 3 Plus Profile (RDL)   CK   Aldolase   Suanne Marker, MD 11/02/2023, 3:18 PM Certified in Neurology, Neurophysiology and Neuroimaging  [facility] [address], [address] [place], [place] [address] [phone]

## 2023-11-07 NOTE — Procedures (Signed)
[facility]  NCS (NERVE CONDUCTION STUDY) WITH EMG (ELECTROMYOGRAPHY) REPORT   STUDY DATE: 11/02/23 PATIENT NAME: Samuel Cortez DOB: Feb 09, 1941 MRN: [id number]  ORDERING CLINICIAN: Joycelyn Schmid, MD   TECHNOLOGIST: Jenelle Mages ELECTROMYOGRAPHER: Glenford Bayley. Caelan Branden, MD  CLINICAL INFORMATION: 83 year old male with muscle weakness.  FINDINGS: NERVE CONDUCTION STUDY:  Right median motor response is prolonged distal latency, normal amplitude and normal conduction positive.  Right ulnar motor responses normal.  Bilateral peroneal and bilateral tibial motor responses have normal distal latencies, decreased amplitudes and borderline slowed conduction velocities.  Left tibial and right ulnar F-wave latencies are normal.  Right tibial F wave latency is slightly prolonged.  Right median, bilateral sural and bilateral superficial peroneal sensory responses could not be obtained.  Right ulnar sensory sponsor has prolonged peak latency and normal amplitude.  Right radial sensory response is normal.    NEEDLE ELECTROMYOGRAPHY:  Needle examination of right upper and right lower extremities notable for early recruitment of decreased amplitude polyphasic motor units throughout the right upper extremity and right vastus medialis.  Decreased recruitment of normal to large size motor units noted in the right tibialis anterior, right gastrocnemius and left tibialis anterior muscles.  Abnormal spontaneous activity noted in the right tibialis anterior with fasciculations.   IMPRESSION:   Abnormal study demonstrating: -Severe axonal sensorimotor polyneuropathy.  Myopathic recruitment pattern noted in the right upper extremity and right vastus medialis.  Chronic denervation in bilateral tibialis anterior and right gastrocnemius muscles.    INTERPRETING PHYSICIAN:  Suanne Marker, MD Certified in Neurology, Neurophysiology and Neuroimaging  [facility] [address] [place], [place] [address] [phone]  [facility]    Nerve / Sites Muscle Latency Ref. Amplitude Ref. Rel Amp Segments Distance Velocity Ref. Area    ms ms mV mV %  cm m/s m/s mVms  R Median - APB     Wrist APB 4.9 <=4.4 4.8 >=4.0 100 Wrist - APB 7   22.4     Upper arm APB 9.8  5.0  104 Upper arm - Wrist 27.6 56 >=49 23.6  R Ulnar - ADM     Wrist ADM 3.2 <=3.3 8.8 >=6.0 100 Wrist - ADM 7   29.1     B.Elbow ADM 4.9  8.5  95.5 B.Elbow - Wrist 9 53 >=49 28.2     A.Elbow ADM 9.3  7.1  84.5 A.Elbow - B.Elbow 24 55 >=49 26.3  R Peroneal - EDB     Ankle EDB 5.3 <=6.5 1.4 >=2.0 100 Ankle - EDB 9   5.4     Fib head EDB 14.0  0.9  66.3 Fib head - Ankle 30 34 >=44 4.0     Pop fossa EDB 16.3  1.4  144 Pop fossa - Fib head 11 48 >=44 4.8         Pop fossa - Ankle      L Peroneal - EDB     Ankle EDB 5.2 <=6.5 1.8 >=2.0 100 Ankle - EDB 9   8.3     Fib head EDB 12.1  1.9  105 Fib head - Ankle 29 42 >=44 8.4     Pop fossa EDB 14.0  1.9  104 Pop fossa - Fib head 11 56 >=44 9.0         Pop fossa - Ankle      R Tibial - AH     Ankle AH 4.8 <=5.8 4.3 >=4.0 100 Ankle -  AH 9   13.1     Pop fossa AH 15.1  3.7  86.5 Pop fossa - Ankle 41 40 >=41 13.4  L Tibial - AH     Ankle AH 5.6 <=5.8 2.2 >=4.0 100 Ankle - AH 9   3.1     Pop fossa AH 16.7  4.0  185 Pop fossa - Ankle 39 35 >=41 12.9                 SNC    Nerve / Sites Rec. Site Peak Lat Ref.  Amp Ref. Segments Distance    ms ms V V  cm  R Radial - Anatomical snuff box (Forearm)     Forearm Wrist 2.9 <=2.9 24 >=15 Forearm - Wrist 10  R Sural - Ankle (Calf)     Calf Ankle NR <=4.4 NR >=6 Calf - Ankle 10  L Sural - Ankle (Calf)     Calf Ankle NR <=4.4 NR >=6 Calf - Ankle 14  R Superficial peroneal - Ankle     Lat leg Ankle NR <=4.4 NR >=6 Lat leg - Ankle 14  L Superficial peroneal - Ankle     Lat leg Ankle NR <=4.4 NR >=6 Lat leg - Ankle 14  R Median - Orthodromic (Dig II, Mid palm)     Dig II Wrist NR <=3.4 NR  >=10 Dig II - Wrist 13  R Ulnar - Orthodromic, (Dig V, Mid palm)     Dig V Wrist 3.7 <=3.1 9 >=5 Dig V - Wrist 100                   F  Wave    Nerve F Lat Ref.   ms ms  R Tibial - AH 59.8 <=56.0  L Tibial - AH 55.0 <=56.0  R Ulnar - ADM 31.8 <=32.0           EMG Summary Table    Spontaneous MUAP Recruitment  Muscle IA Fib PSW Fasc Other Amp Dur. Poly Pattern  R. Deltoid Normal None None None _______ Decreased Increased 2+ Early  R. Biceps brachii Normal None None None _______ Decreased Increased 2+ Early  R. Triceps brachii Normal None None None _______ Decreased Increased 2+ Early  R. Flexor carpi radialis Normal None None None _______ Decreased Increased 1+ Early  R. First dorsal interosseous Normal None None None _______ Decreased Increased 1+ Early  R. Vastus medialis Normal None None None _______ Decreased Increased 1+ Early  R. Tibialis anterior Normal 2+ 2+ Rare _______ Increased Normal Normal Reduced  R. Gastrocnemius (Medial head) Normal None None None _______ Normal Normal 1+ Reduced  L. Tibialis anterior Normal None None None _______ Increased Normal Normal Reduced

## 2023-11-23 ENCOUNTER — Telehealth: Payer: Self-pay | Admitting: Diagnostic Neuroimaging

## 2023-11-23 NOTE — Telephone Encounter (Signed)
Please result when available  

## 2023-11-23 NOTE — Telephone Encounter (Signed)
 Pt called wanting to know when he is going to get a call with his last Blood work results. Please advise.

## 2023-11-27 DIAGNOSIS — M109 Gout, unspecified: Secondary | ICD-10-CM | POA: Diagnosis not present

## 2023-11-27 DIAGNOSIS — I129 Hypertensive chronic kidney disease with stage 1 through stage 4 chronic kidney disease, or unspecified chronic kidney disease: Secondary | ICD-10-CM | POA: Diagnosis not present

## 2023-11-27 DIAGNOSIS — N401 Enlarged prostate with lower urinary tract symptoms: Secondary | ICD-10-CM | POA: Diagnosis not present

## 2023-11-27 DIAGNOSIS — N183 Chronic kidney disease, stage 3 unspecified: Secondary | ICD-10-CM | POA: Diagnosis not present

## 2023-11-27 DIAGNOSIS — E785 Hyperlipidemia, unspecified: Secondary | ICD-10-CM | POA: Diagnosis not present

## 2023-11-28 LAB — MYOMARKER 3 PLUS PROFILE (RDL)

## 2023-11-28 LAB — ALDOLASE: Aldolase: 4.9 U/L (ref 3.3–10.3)

## 2023-11-28 LAB — ANTI-CN-1A (NT5C1A) IBM (RDL): Anti-cN-1A (NT5c1A) IBM (RDL): <20 U (ref <20)

## 2023-11-28 LAB — CK: Total CK: 66 U/L (ref 30–208)

## 2023-11-29 NOTE — Progress Notes (Signed)
 Results are good, no major findings. Continue current plan. -VRP

## 2023-12-04 DIAGNOSIS — Z1331 Encounter for screening for depression: Secondary | ICD-10-CM | POA: Diagnosis not present

## 2023-12-04 DIAGNOSIS — I700 Atherosclerosis of aorta: Secondary | ICD-10-CM | POA: Diagnosis not present

## 2023-12-04 DIAGNOSIS — M1990 Unspecified osteoarthritis, unspecified site: Secondary | ICD-10-CM | POA: Diagnosis not present

## 2023-12-04 DIAGNOSIS — N401 Enlarged prostate with lower urinary tract symptoms: Secondary | ICD-10-CM | POA: Diagnosis not present

## 2023-12-04 DIAGNOSIS — R82998 Other abnormal findings in urine: Secondary | ICD-10-CM | POA: Diagnosis not present

## 2023-12-04 DIAGNOSIS — I129 Hypertensive chronic kidney disease with stage 1 through stage 4 chronic kidney disease, or unspecified chronic kidney disease: Secondary | ICD-10-CM | POA: Diagnosis not present

## 2023-12-04 DIAGNOSIS — Z1339 Encounter for screening examination for other mental health and behavioral disorders: Secondary | ICD-10-CM | POA: Diagnosis not present

## 2023-12-04 DIAGNOSIS — H409 Unspecified glaucoma: Secondary | ICD-10-CM | POA: Diagnosis not present

## 2023-12-04 DIAGNOSIS — M1120 Other chondrocalcinosis, unspecified site: Secondary | ICD-10-CM | POA: Diagnosis not present

## 2023-12-04 DIAGNOSIS — E785 Hyperlipidemia, unspecified: Secondary | ICD-10-CM | POA: Diagnosis not present

## 2023-12-04 DIAGNOSIS — M21371 Foot drop, right foot: Secondary | ICD-10-CM | POA: Diagnosis not present

## 2023-12-04 DIAGNOSIS — Z981 Arthrodesis status: Secondary | ICD-10-CM | POA: Diagnosis not present

## 2023-12-04 DIAGNOSIS — Z0000 Encounter for general adult medical examination without abnormal findings: Secondary | ICD-10-CM | POA: Diagnosis not present

## 2023-12-04 DIAGNOSIS — R634 Abnormal weight loss: Secondary | ICD-10-CM | POA: Diagnosis not present

## 2023-12-04 DIAGNOSIS — N183 Chronic kidney disease, stage 3 unspecified: Secondary | ICD-10-CM | POA: Diagnosis not present

## 2023-12-04 DIAGNOSIS — K219 Gastro-esophageal reflux disease without esophagitis: Secondary | ICD-10-CM | POA: Diagnosis not present

## 2023-12-21 DIAGNOSIS — H401131 Primary open-angle glaucoma, bilateral, mild stage: Secondary | ICD-10-CM | POA: Diagnosis not present

## 2024-02-07 DIAGNOSIS — H401132 Primary open-angle glaucoma, bilateral, moderate stage: Secondary | ICD-10-CM | POA: Diagnosis not present

## 2024-02-07 DIAGNOSIS — Z961 Presence of intraocular lens: Secondary | ICD-10-CM | POA: Diagnosis not present

## 2024-02-19 ENCOUNTER — Other Ambulatory Visit: Payer: Self-pay | Admitting: Internal Medicine

## 2024-02-19 DIAGNOSIS — J312 Chronic pharyngitis: Secondary | ICD-10-CM

## 2024-02-20 ENCOUNTER — Ambulatory Visit
Admission: RE | Admit: 2024-02-20 | Discharge: 2024-02-20 | Disposition: A | Discharge status: Home / Self Care | Source: Ambulatory Visit | Attending: Internal Medicine | Admitting: Internal Medicine

## 2024-02-20 DIAGNOSIS — E041 Nontoxic single thyroid nodule: Secondary | ICD-10-CM | POA: Diagnosis not present

## 2024-02-20 DIAGNOSIS — J312 Chronic pharyngitis: Secondary | ICD-10-CM

## 2024-02-27 ENCOUNTER — Other Ambulatory Visit: Payer: Self-pay | Admitting: Internal Medicine

## 2024-02-27 DIAGNOSIS — R221 Localized swelling, mass and lump, neck: Secondary | ICD-10-CM

## 2024-02-29 ENCOUNTER — Other Ambulatory Visit (HOSPITAL_COMMUNITY)
Admission: RE | Admit: 2024-02-29 | Discharge: 2024-02-29 | Disposition: A | Discharge status: Home / Self Care | Source: Ambulatory Visit | Attending: Internal Medicine | Admitting: Internal Medicine

## 2024-02-29 ENCOUNTER — Ambulatory Visit
Admission: RE | Admit: 2024-02-29 | Discharge: 2024-02-29 | Disposition: A | Discharge status: Home / Self Care | Source: Ambulatory Visit | Attending: Internal Medicine | Admitting: Internal Medicine

## 2024-02-29 DIAGNOSIS — E041 Nontoxic single thyroid nodule: Secondary | ICD-10-CM | POA: Diagnosis present

## 2024-02-29 DIAGNOSIS — R221 Localized swelling, mass and lump, neck: Secondary | ICD-10-CM

## 2024-03-04 LAB — CYTOLOGY - NON PAP

## 2024-04-03 DIAGNOSIS — R499 Unspecified voice and resonance disorder: Secondary | ICD-10-CM | POA: Diagnosis not present

## 2024-04-03 DIAGNOSIS — R1310 Dysphagia, unspecified: Secondary | ICD-10-CM | POA: Diagnosis not present

## 2024-04-03 NOTE — Progress Notes (Signed)
 REFERRING PROVIDER: Self Subjective:     Samuel Cortez is a 83 y.o. male who is seen in consultation at the request of patient for evaluation of dysphagia.   Today, here with wife, [name]. Patient reports a 31lb weight loss secondary to pain and difficulty swallowing over the past 1-2 years. Pain in throat sometimes radiates to both ears, although L more than right. Patient feels his symptoms started after C4-C5 ACDF 2019. Feels food is getting stuck in his throat. No choking episodes. Feels he has alot of post nasal drainage and feels his voice has changed a little due to this. Also endorses left sided neck swelling that comes and goes and feels it is swollen today. The neck swelling does not hurt.  Patient has been evaluated by several ENT's and his PCP over the past year for dysphagia, odynophagia, weight loss and swelling of neck. US  thyroid  02/20/24 showed one thyroid  nodule; a right inferior 1.4cm TR5 nodule. He underwent thyroid  nodule FNA 02/29/24 which was nondiagnostic and insufficient. Patient states they sent his pathology to [place]  and has not heard any further updates. Had a CT neck and barrium swallow in 2024.  Barium swallow demonstrated bulky cervical spurs impinging and narrowing hypopharynx and interfering with epiglotic inversion. He also saw his spine surgeon for this. Both ENT and spine surgeon recommended he see speech pathology for dysphagia, but patient declines. Patient is seeing neurology for further workup of myopathy, weightloss and dyspahagia.    Former Acupuncturist. Daily alcohol  use; 1 drink per day. Denies personal history of cancer including skin cancer. Denies family history of head and neck cancer and lymphoma.   The following components of the patients medical history were reviewed and updated as appropriate on 04/03/2024, and are included in the MEDICAL RECORD NUMBERallergies, medications, past medical history, past surgical history, social history, and family  history.  US  thyroid  6/3/225: Nodule # 1:   Location: RIGHT; inferior   Maximum size: 1.4 cm; Other 2 dimensions: 1.1 x 1.2 cm   Composition: solid/almost completely solid (2)   Echogenicity: hypoechoic (2)   Shape: taller-than-wide (3)   Margins: ill-defined (0)   Echogenic foci: none (0)   ACR TI-RADS total points: 7.   ACR TI-RADS risk category: TR5 (>/= 7 points).   ACR TI-RADS recommendations:   **Given size (>/= 1.0 cm) and appearance, fine needle aspiration of  this highly suspicious nodule should be considered based on TI-RADS  criteria.   _________________________________________________________   IMPRESSION:  Nodule 1 (TI-RADS 4), measuring 1.4 cm, located in the inferior  thyroid  lobe, meets criteria for FNA.   The above is in keeping with the ACR TI-RADS recommendations - J Am  Coll Radiol 2017;14:587-595.   CT abdomen/pelvis 12/4/224: IMPRESSION:  1. No acute findings, mass or adenopathy.  2. Cholelithiasis.  3. Coronary and  Aortic Atherosclerosis (ICD10-I70.0).   CT Head 03/23/2023 FINDINGS:  Brain: No intracranial hemorrhage, mass effect, or evidence of acute  infarct. No hydrocephalus. No extra-axial fluid collection.  Generalized cerebral atrophy. Ill-defined hypoattenuation within the  cerebral white matter is nonspecific but consistent with chronic  small vessel ischemic disease.   Vascular: No hyperdense vessel. Intracranial arterial calcification.   Skull: No fracture or focal lesion.   Sinuses/Orbits: No acute finding. Paranasal sinuses and mastoid air  cells are well aerated.   Other: None.   IMPRESSION:  No acute intracranial abnormality.   Age commensurate cerebral atrophy and chronic microvascular ischemic  change.   CT Neck  9/27/223 FINDINGS:  Pharynx and larynx: No evidence of mass or inflammation   Salivary glands: No inflammation, mass, or stone.   Thyroid : 2 right-sided nodules measuring up to 8 mm. No followup   recommended (ref: J Am Coll Radiol. 2015 Feb;12(2): 143-50).   Lymph nodes: None enlarged or abnormal density.   Vascular: Extensive atheromatous calcification.   Limited intracranial: No unexpected finding   Visualized orbits: Minimal coverage is negative   Mastoids and visualized paranasal sinuses: Clear where covered.   Skeleton: C4-5 ACDF with solid arthrodesis. Bridging and bulky  ventral osteophytes span C2-T5. Cervical spurs encroach on the  hypopharynx. No visible diverticulum.   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   Upper chest: Negative   IMPRESSION:  1. No specific cause for symptoms. No mass or adenopathy in the  neck.  2. Diffuse idiopathic skeletal hyperostosis with ankylosis from C2  to at least T5. Bulky cervical spurs encroach on the hypopharynx.    Pathology US  FNA right inferior TR5 nodule: 03/04/2024 Component 1 mo ago  CYTOLOGY - NON GYN CYTOLOGY - NON PAP CASE: [id number] PATIENT: Aarish Newbold Non-Gynecological Cytology Report     Clinical History: Nodule 1 (TI-RADS 4), measuring 1.4 cm, located in the inferior thyroid  lobe, meets criteria for FNA. Specimen Submitted:  A. THYROID , RIGHT INFERIOR LOBE, FINE NEEDLE ASPIRATION   FINAL MICROSCOPIC DIAGNOSIS: - Nondiagnostic material - Insufficient follicular cells ([facility] I)  SPECIMEN ADEQUACY: Satisfactory but limited for evaluation, scant cellularity  GROSS: Received is/are 30cc's of red color fluid in cytolyt solution. (TC:tc) Smears:0 Concentration Method (ThinPrep): 1 Cell Block: Cell block attempted but not obtained. Additional Studies: Afirma Collected.     Final Diagnosis performed by Rexene Daily, MD.   Electronically signed 03/04/2024 Technical component performed at [employer]. [facility], [address] [place], [place] [address].  Professional component performed at [facility]. [address] [place], [place] [address]  Immunohistochemistry Technical component (if applicable) was performed at [employer]. [address], [address], [place], [place] [address].  IMMUNOHISTOCHEMISTRY DISCLAIMER (if applicable): Some of these immunohistochemical stains may have been developed and the performance characteristics determine by [facility]. Some may not have been cleared or approved by the U.S. Food and Drug Administration. The FDA has determined that such clearance or approval is not necessary. This test is used for clinical purposes. It should not be regarded as investigational or for research. This laboratory is certified under the Clinical Laboratory Improvement Amendments of 1988 (CLIA-88) as qualified to perform high complexity clinical laboratory testing.  The controls stained appropriately.   Review of Systems A 10 point review of systems was obtained from the patient and is documented in the HPI and Nursing Progress note for today's visit. I have reviewed this information and discussed as appropriate with the patient.    Objective:   Physical Exam:  BP 111/73   Pulse 78   Temp 36.9 C (98.4 F) (Oral)   Ht 175.3 cm (5' 9)   Wt 63.5 kg (140 lb)   BMI 20.67 kg/m   General appearance: Alert, well appearing, and in no distress. Oriented to person, place, and time. No evidence of nutritional abnormality.  Eyes: Pupils equal and reactive, extraocular eye movements intact, normal lid position, closure and opening.  Ears: not examined   Nose: Patent nasal passages. No erythema, discharge, purulence, or polyps.  Sinuses nontender to palpation. Septum intact without significant deviation. External  appearance symmetric without gross deformity.  Face: Normal symmetry. Normal bilateral facial motion and sensation. No cutaneous lesions evident. No facial mass, tenderness or swelling. No palpable abnormality of the parotid glands.  Oral  Cavity: Normal appearing mucosa of tongue, floor of mouth, buccal mucosa, and palate. Upper and lower partials removed for exam. Symmetric tongue mobility with normal protrusion.  Oropharynx: Mucous membranes moist, pharynx normal without lesions or asymmetry, tonsils normal.   Larynx: Difficult to fully examine VC due to posterior pharyngeal wall collapse. What is visualized appears to be normal VC motion. Normal laryngeal mucosal appearance without erythema, ulceration, polyps, nodules or masses. Some pooling of secretions.  Pyriform sinuses clear bilaterally.  Nasopharynx: Eustachian tube orifices patent. No evidence of masses.  Neck: No palpable adenopathy. No visible or palpable neck masses. Thyroid  is normal in size without palpable nodules or tenderness. Normal laryngeal crepitus. Neck flexion and extension normal. No neck swelling appreciated.   Lymphatic: See neck exam  Pulmonary: No stridor, wheezing, tachypnea. No suprasternal retractions evident.  Cardiovascular: Peripheral pulses symmetric and normal. No evidence of pedal edema. No clubbing or cyanosis.  Neurological: Alert and oriented, with normal speech. No focal findings or movement disorder noted.  Cranial nerves II through XII intact.  Motor and sensory exam grossly normal bilaterally. Gait normal.   Musculoskeletal: No joint tenderness, deformity or swelling. No muscular tenderness noted over TMJs bilaterally.   Procedure:  Flexible Laryngoscopy Indications: inadequate mirror exam secondary to gag, inadequate mirror exam secondary to patient anatomy, and photodocumentation. Pre-procedure Diagnosis:  dysphagia Post-procedure Diagnosis:  Same Anesthesia:  none Procedure Details:   The patient was placed in the sitting position.  If needed, topical anesthesia and decongestion was administered and time allowed for effect, a 4 mm flexible laryngoscope was passed along the floor of the nose.  The nasal cavities,  nasopharynx, oropharynx, hypopharynx, and larynx were all examined.  Vocal cords, if applicable, were examined during respiration and phonation.  The following findings were noted: Findings:  As noted in the above physical examination. Condition: Stable.  Patient tolerated procedure well. Complications: None  Imaging:           Assessment:     ICD-10-CM   1. Dysphagia, unspecified type  R13.10 Ambulatory Referral to Otolaryngology    Ambulatory Referral to Speech Pathology    2. Change in voice  R49.9 Ambulatory Referral to Otolaryngology    Ambulatory Referral to Speech Pathology      83yo male with history of C4-C5 ACDF (2019) who developed pain and difficulty swallowing x 1-2 years with associated weight loss. Physical exam is reassuring today regarding malignancy. I discussed importance of seeing a spine surgeon to discuss management of his bulky cervical spurs which I believe are the primary contributing factor to his symptoms.   Plan:  -scoped -placed referral to laryngology and speech for link same day eval -I recommended patient f/u with spine specialist. I rec placing referral to [facility], but patient declined and wishes to f/u with his spine specialist locally to discuss  -thyroid  nodule: I rec repeat FNA today while at [facility]. Patient declined and wishes to wait on further results locally. He will let me know updates    Issues concerning treatment and diagnosis were discussed with the patient.  There were no barriers to understanding the plan of treatment.  Explanation was well received by patient, who then verbalized understanding.  Updated reconciled list of the patients medications was reviewed and provided to the patient.  I spent a total of 60 minutes in both face-to-face and non-face-to-face activities, excluding procedures performed, for this visit on the date of this encounter.   Attestation Statement:   I personally performed the service,  non-incident to. (WP)   TINNIE ALMARIE PIA, PA

## 2024-05-19 ENCOUNTER — Inpatient Hospital Stay (HOSPITAL_COMMUNITY)

## 2024-05-19 ENCOUNTER — Other Ambulatory Visit: Payer: Self-pay

## 2024-05-19 ENCOUNTER — Encounter (HOSPITAL_BASED_OUTPATIENT_CLINIC_OR_DEPARTMENT_OTHER): Payer: Self-pay

## 2024-05-19 ENCOUNTER — Emergency Department (HOSPITAL_BASED_OUTPATIENT_CLINIC_OR_DEPARTMENT_OTHER)

## 2024-05-19 ENCOUNTER — Inpatient Hospital Stay (HOSPITAL_BASED_OUTPATIENT_CLINIC_OR_DEPARTMENT_OTHER)
Admission: EM | Admit: 2024-05-19 | Discharge: 2024-06-19 | DRG: 208 | Disposition: E | Attending: Internal Medicine | Admitting: Internal Medicine

## 2024-05-19 DIAGNOSIS — F05 Delirium due to known physiological condition: Secondary | ICD-10-CM | POA: Diagnosis not present

## 2024-05-19 DIAGNOSIS — Z888 Allergy status to other drugs, medicaments and biological substances status: Secondary | ICD-10-CM

## 2024-05-19 DIAGNOSIS — J9602 Acute respiratory failure with hypercapnia: Secondary | ICD-10-CM | POA: Diagnosis not present

## 2024-05-19 DIAGNOSIS — Z4682 Encounter for fitting and adjustment of non-vascular catheter: Secondary | ICD-10-CM | POA: Diagnosis not present

## 2024-05-19 DIAGNOSIS — E8729 Other acidosis: Secondary | ICD-10-CM | POA: Diagnosis not present

## 2024-05-19 DIAGNOSIS — J9601 Acute respiratory failure with hypoxia: Secondary | ICD-10-CM | POA: Diagnosis not present

## 2024-05-19 DIAGNOSIS — R061 Stridor: Secondary | ICD-10-CM | POA: Diagnosis not present

## 2024-05-19 DIAGNOSIS — R0603 Acute respiratory distress: Secondary | ICD-10-CM

## 2024-05-19 DIAGNOSIS — I10 Essential (primary) hypertension: Secondary | ICD-10-CM | POA: Diagnosis present

## 2024-05-19 DIAGNOSIS — Z66 Do not resuscitate: Secondary | ICD-10-CM | POA: Diagnosis present

## 2024-05-19 DIAGNOSIS — R1310 Dysphagia, unspecified: Secondary | ICD-10-CM | POA: Diagnosis not present

## 2024-05-19 DIAGNOSIS — N179 Acute kidney failure, unspecified: Secondary | ICD-10-CM | POA: Diagnosis not present

## 2024-05-19 DIAGNOSIS — R34 Anuria and oliguria: Secondary | ICD-10-CM | POA: Diagnosis not present

## 2024-05-19 DIAGNOSIS — Z681 Body mass index (BMI) 19 or less, adult: Secondary | ICD-10-CM

## 2024-05-19 DIAGNOSIS — T4275XA Adverse effect of unspecified antiepileptic and sedative-hypnotic drugs, initial encounter: Secondary | ICD-10-CM | POA: Diagnosis not present

## 2024-05-19 DIAGNOSIS — R578 Other shock: Secondary | ICD-10-CM | POA: Diagnosis not present

## 2024-05-19 DIAGNOSIS — M4322 Fusion of spine, cervical region: Secondary | ICD-10-CM | POA: Diagnosis present

## 2024-05-19 DIAGNOSIS — E43 Unspecified severe protein-calorie malnutrition: Secondary | ICD-10-CM | POA: Diagnosis not present

## 2024-05-19 DIAGNOSIS — M109 Gout, unspecified: Secondary | ICD-10-CM | POA: Diagnosis present

## 2024-05-19 DIAGNOSIS — M4302 Spondylolysis, cervical region: Secondary | ICD-10-CM | POA: Diagnosis not present

## 2024-05-19 DIAGNOSIS — Z981 Arthrodesis status: Secondary | ICD-10-CM | POA: Diagnosis not present

## 2024-05-19 DIAGNOSIS — Z9109 Other allergy status, other than to drugs and biological substances: Secondary | ICD-10-CM

## 2024-05-19 DIAGNOSIS — E041 Nontoxic single thyroid nodule: Secondary | ICD-10-CM | POA: Diagnosis not present

## 2024-05-19 DIAGNOSIS — R0602 Shortness of breath: Secondary | ICD-10-CM | POA: Diagnosis not present

## 2024-05-19 DIAGNOSIS — R911 Solitary pulmonary nodule: Secondary | ICD-10-CM | POA: Diagnosis present

## 2024-05-19 DIAGNOSIS — I959 Hypotension, unspecified: Secondary | ICD-10-CM | POA: Diagnosis not present

## 2024-05-19 DIAGNOSIS — M2578 Osteophyte, vertebrae: Secondary | ICD-10-CM | POA: Diagnosis present

## 2024-05-19 DIAGNOSIS — Z515 Encounter for palliative care: Secondary | ICD-10-CM | POA: Diagnosis not present

## 2024-05-19 DIAGNOSIS — I77810 Thoracic aortic ectasia: Secondary | ICD-10-CM | POA: Diagnosis not present

## 2024-05-19 DIAGNOSIS — R918 Other nonspecific abnormal finding of lung field: Secondary | ICD-10-CM | POA: Diagnosis not present

## 2024-05-19 DIAGNOSIS — J398 Other specified diseases of upper respiratory tract: Secondary | ICD-10-CM | POA: Diagnosis present

## 2024-05-19 DIAGNOSIS — Z87891 Personal history of nicotine dependence: Secondary | ICD-10-CM | POA: Diagnosis not present

## 2024-05-19 DIAGNOSIS — Z7401 Bed confinement status: Secondary | ICD-10-CM

## 2024-05-19 DIAGNOSIS — Z7189 Other specified counseling: Secondary | ICD-10-CM

## 2024-05-19 DIAGNOSIS — M452 Ankylosing spondylitis of cervical region: Secondary | ICD-10-CM | POA: Diagnosis not present

## 2024-05-19 DIAGNOSIS — J955 Postprocedural subglottic stenosis: Secondary | ICD-10-CM | POA: Diagnosis not present

## 2024-05-19 DIAGNOSIS — Z91048 Other nonmedicinal substance allergy status: Secondary | ICD-10-CM | POA: Diagnosis not present

## 2024-05-19 DIAGNOSIS — J383 Other diseases of vocal cords: Secondary | ICD-10-CM | POA: Diagnosis present

## 2024-05-19 DIAGNOSIS — R64 Cachexia: Secondary | ICD-10-CM | POA: Diagnosis present

## 2024-05-19 LAB — BASIC METABOLIC PANEL WITH GFR
Anion gap: 15 (ref 5–15)
BUN: 19 mg/dL (ref 8–23)
CO2: 28 mmol/L (ref 22–32)
Calcium: 10.3 mg/dL (ref 8.9–10.3)
Chloride: 93 mmol/L — ABNORMAL LOW (ref 98–111)
Creatinine, Ser: 0.73 mg/dL (ref 0.61–1.24)
GFR, Estimated: >60 mL/min (ref >60)
Glucose, Bld: 131 mg/dL — ABNORMAL HIGH (ref 70–99)
Potassium: 3.8 mmol/L (ref 3.5–5.1)
Sodium: 136 mmol/L (ref 135–145)

## 2024-05-19 LAB — I-STAT VENOUS BLOOD GAS, ED
Acid-Base Excess: 11 mmol/L — ABNORMAL HIGH (ref 0.0–2.0)
Bicarbonate: 40.5 mmol/L — ABNORMAL HIGH (ref 20.0–28.0)
Calcium, Ion: 1.16 mmol/L (ref 1.15–1.40)
HCT: 46 % (ref 39.0–52.0)
Hemoglobin: 15.6 g/dL (ref 13.0–17.0)
O2 Saturation: 84 %
Patient temperature: 98.6
Potassium: 4.9 mmol/L (ref 3.5–5.1)
Sodium: 135 mmol/L (ref 135–145)
TCO2: 43 mmol/L — ABNORMAL HIGH (ref 22–32)
pCO2, Ven: 70.7 mmHg (ref 44–60)
pH, Ven: 7.366 (ref 7.25–7.43)
pO2, Ven: 52 mmHg — ABNORMAL HIGH (ref 32–45)

## 2024-05-19 LAB — CBC
HCT: 47.2 % (ref 39.0–52.0)
Hemoglobin: 16.4 g/dL (ref 13.0–17.0)
MCH: 31.1 pg (ref 26.0–34.0)
MCHC: 34.7 g/dL (ref 30.0–36.0)
MCV: 89.6 fL (ref 80.0–100.0)
Platelets: 435 K/uL — ABNORMAL HIGH (ref 150–400)
RBC: 5.27 MIL/uL (ref 4.22–5.81)
RDW: 13.6 % (ref 11.5–15.5)
WBC: 13.2 K/uL — ABNORMAL HIGH (ref 4.0–10.5)
nRBC: 0 % (ref 0.0–0.2)

## 2024-05-19 LAB — PRO BRAIN NATRIURETIC PEPTIDE: Pro Brain Natriuretic Peptide: 1036 pg/mL — ABNORMAL HIGH (ref <300.0)

## 2024-05-19 LAB — GLUCOSE, CAPILLARY
Glucose-Capillary: 150 mg/dL — ABNORMAL HIGH (ref 70–99)
Glucose-Capillary: 135 mg/dL — ABNORMAL HIGH (ref 70–99)
Glucose-Capillary: 154 mg/dL — ABNORMAL HIGH (ref 70–99)

## 2024-05-19 LAB — MRSA NEXT GEN BY PCR, NASAL: MRSA by PCR Next Gen: NOT DETECTED

## 2024-05-19 MED ORDER — POLYETHYLENE GLYCOL 3350 17 G PO PACK
17.0000 g | PACK | Freq: Every day | ORAL | Status: DC | PRN
Start: 2024-05-19 — End: 2024-05-24

## 2024-05-19 MED ORDER — IOHEXOL 300 MG/ML  SOLN: 100.0000 mL | Freq: Once | INTRAMUSCULAR | Status: DC | PRN

## 2024-05-19 MED ORDER — RACEPINEPHRINE HCL 2.25 % IN NEBU
INHALATION_SOLUTION | RESPIRATORY_TRACT | Status: AC
  Filled 2024-05-19: qty 15

## 2024-05-19 MED ORDER — IPRATROPIUM-ALBUTEROL 0.5-2.5 (3) MG/3ML IN SOLN: 3.0000 mL | Freq: Four times a day (QID) | RESPIRATORY_TRACT | Status: DC | PRN

## 2024-05-19 MED ORDER — RACEPINEPHRINE HCL 2.25 % IN NEBU
0.5000 mL | INHALATION_SOLUTION | Freq: Once | RESPIRATORY_TRACT | Status: AC
  Administered 2024-05-19: 0.5 mL via RESPIRATORY_TRACT

## 2024-05-19 MED ORDER — ORAL CARE MOUTH RINSE: 15.0000 mL | OROMUCOSAL | Status: DC | PRN

## 2024-05-19 MED ORDER — LABETALOL HCL 5 MG/ML IV SOLN
10.0000 mg | INTRAVENOUS | Status: DC | PRN
  Administered 2024-05-19 – 2024-05-20 (×2): 10 mg via INTRAVENOUS
  Filled 2024-05-19 (×2): qty 4

## 2024-05-19 MED ORDER — CHLORHEXIDINE GLUCONATE CLOTH 2 % EX PADS
6.0000 | MEDICATED_PAD | Freq: Every day | CUTANEOUS | Status: DC
  Administered 2024-05-19 – 2024-05-24 (×5): 6 via TOPICAL

## 2024-05-19 MED ORDER — RACEPINEPHRINE HCL 2.25 % IN NEBU
0.5000 mL | INHALATION_SOLUTION | Freq: Once | RESPIRATORY_TRACT | Status: DC
  Filled 2024-05-19: qty 15

## 2024-05-19 MED ORDER — SODIUM CHLORIDE 0.9 % IV SOLN
3.0000 g | Freq: Four times a day (QID) | INTRAVENOUS | Status: DC
  Administered 2024-05-19 – 2024-05-21 (×7): 3 g via INTRAVENOUS
  Filled 2024-05-19 (×7): qty 8

## 2024-05-19 MED ORDER — IPRATROPIUM-ALBUTEROL 0.5-2.5 (3) MG/3ML IN SOLN
3.0000 mL | Freq: Four times a day (QID) | RESPIRATORY_TRACT | Status: DC
  Administered 2024-05-19 – 2024-05-22 (×12): 3 mL via RESPIRATORY_TRACT
  Filled 2024-05-19 (×14): qty 3

## 2024-05-19 MED ORDER — DEXAMETHASONE SODIUM PHOSPHATE 4 MG/ML IJ SOLN
4.0000 mg | Freq: Four times a day (QID) | INTRAMUSCULAR | Status: AC
Start: 2024-05-19 — End: 2024-05-22
  Administered 2024-05-19 – 2024-05-22 (×12): 4 mg via INTRAVENOUS
  Filled 2024-05-19 (×12): qty 1

## 2024-05-19 MED ORDER — RACEPINEPHRINE HCL 2.25 % IN NEBU
INHALATION_SOLUTION | RESPIRATORY_TRACT | Status: AC
  Filled 2024-05-19: qty 0.5

## 2024-05-19 MED ORDER — DEXAMETHASONE SODIUM PHOSPHATE 10 MG/ML IJ SOLN
10.0000 mg | Freq: Once | INTRAMUSCULAR | Status: AC
  Administered 2024-05-19: 10 mg via INTRAVENOUS
  Filled 2024-05-19: qty 1

## 2024-05-19 MED ORDER — DOCUSATE SODIUM 100 MG PO CAPS
100.0000 mg | ORAL_CAPSULE | Freq: Two times a day (BID) | ORAL | Status: DC | PRN
  Filled 2024-05-19: qty 1

## 2024-05-19 MED ORDER — HYDRALAZINE HCL 20 MG/ML IJ SOLN
10.0000 mg | INTRAMUSCULAR | Status: DC | PRN
  Administered 2024-05-20: 10 mg via INTRAVENOUS
  Filled 2024-05-19: qty 1

## 2024-05-19 NOTE — ED Provider Notes (Signed)
 This patient was transferred from [facility] health ED at [facility] given concern for difficulty breathing with stridor.  Concern for upper airway obstruction and dysphagia.  Patient currently DNR/DNI.  Dr. Yolande has already discussed with ENT and ICU who are expecting patient's arrival here in the ED.  1621: Dr. Claudene (ICU has evaluated patient at bedside.  Patient remains DNR but would be open to intubation if necessary.  Dr. Claudene has ordered CT neck and will discuss with ENT.  He will be admitted to ICU.  Patient's respiration may stable at this point   Pamella Ozell LABOR, DO 05/19/24 1622

## 2024-05-19 NOTE — Consult Note (Signed)
 ENT CONSULT:  Reason for Consult: Stridor, dysphagia  Referring Physician:  ED/ICU  HPI: Samuel Cortez is an 83 y.o. male with longstanding large anterior cervical osteophytes.  He had an anterior cervical spine surgery in 2019 and reports that his dysphagia started to worsen at that point and has been progressively worsening.  Family reports that he has lost approximately 70 pounds over this time.  He was also seen at [facility] about 6 weeks ago for worsening swallowing symptoms and laryngoscopy was performed at that time.  He reports that over the last 1 to 2 days that his breathing has become increasingly more difficult.  About 4 days ago he felt increased mucus and phlegm which was difficult to clear along with some voice changes related to this.   Past Medical History:  Diagnosis Date   Gout    Hypertension     Past Surgical History:  Procedure Laterality Date   ANTERIOR CERVICAL DECOMP/DISCECTOMY FUSION N/A 08/31/2018   Procedure: ANTERIOR CERVICAL DECOMPRESSION/DISCECTOMY FUSION CERVICAL FOUR-CERVICAL FIVE;  Surgeon: Unice Pac, MD;  Location: [facility] OR;  Service: Neurosurgery;  Laterality: N/A;  ANTERIOR CERVICAL DECOMPRESSION/DISCECTOMY FUSION CERVICAL FOUR-CERVICAL FIVE   TONSILLECTOMY      History reviewed. No pertinent family history.  Social History:  reports that he has quit smoking. He has never used smokeless tobacco. He reports current alcohol  use of about 2.0 standard drinks of alcohol  per week. He reports that he does not use drugs.  Allergies:  Allergies  Allergen Reactions   Pollen Extract Other (See Comments)    Lightheadedness, runny nose and itchy eyes (trees and shrubs, also)   Atorvastatin Other (See Comments)    Reaction type/severity unknown   Celecoxib Other (See Comments)    Reaction type/severity unknown    Pravastatin Other (See Comments)    Reaction type/severity unknown    Vibegron Other (See Comments)    Reaction type/severity unknown     Adhesive [Tape] Rash    Medications: I have reviewed the patient's current medications.  Results for orders placed or performed during the hospital encounter of 05/19/24 (from the past 48 hours)  Basic metabolic panel     Status: Abnormal   Collection Time: 05/19/24  2:02 PM  Result Value Ref Range   Sodium 136 135 - 145 mmol/L   Potassium 3.8 3.5 - 5.1 mmol/L   Chloride 93 (L) 98 - 111 mmol/L   CO2 28 22 - 32 mmol/L   Glucose, Bld 131 (H) 70 - 99 mg/dL    Comment: Glucose reference range applies only to samples taken after fasting for at least 8 hours.   BUN 19 8 - 23 mg/dL   Creatinine, Ser 9.26 0.61 - 1.24 mg/dL   Calcium 89.6 8.9 - 89.6 mg/dL   GFR, Estimated >39 >39 mL/min    Comment: (NOTE) Calculated using the CKD-EPI Creatinine Equation (2021)    Anion gap 15 5 - 15    Comment: Performed at [facility], [address] [place], [place], [place] [address]  CBC     Status: Abnormal   Collection Time: 05/19/24  2:02 PM  Result Value Ref Range   WBC 13.2 (H) 4.0 - 10.5 K/uL   RBC 5.27 4.22 - 5.81 MIL/uL   Hemoglobin 16.4 13.0 - 17.0 g/dL   HCT 52.7 60.9 - 47.9 %   MCV 89.6 80.0 - 100.0 fL   MCH 31.1 26.0 - 34.0 pg   MCHC 34.7 30.0 - 36.0 g/dL   RDW  13.6 11.5 - 15.5 %   Platelets 435 (H) 150 - 400 K/uL   nRBC 0.0 0.0 - 0.2 %    Comment: Performed at [facility], [address], [place], [place] [address]  Pro Brain natriuretic peptide     Status: Abnormal   Collection Time: 05/19/24  2:02 PM  Result Value Ref Range   Pro Brain Natriuretic Peptide 1,036.0 (H) <300.0 pg/mL    Comment: (NOTE) Age Group        Cut-Points    Interpretation  < 50 years     450 pg/mL       NT-proBNP > 450 pg/mL indicates                                ADHF is likely              50 to 75 years  900 pg/mL      NT-proBNP > 900 pg/mL indicates          ADHF is likely  > 75 years      1800 pg/mL     NT-proBNP > 1800 pg/mL indicates          ADHF is likely                            All ages    Results between       Indeterminate. Further clinical             300 and the cut-   information is needed to determine            point for age group   if ADHF is present.                                                             Elecsys proBNP II/ Elecsys proBNP II STAT           Cut-Point                       Interpretation  300 pg/mL                    NT-proBNP <300pg/mL indicates                             ADHF is not likely  Performed at [facility], [address] [place], [place] [address]   I-Stat venous blood gas, [facility], [facility], [facility])     Status: Abnormal   Collection Time: 05/19/24  2:16 PM  Result Value Ref Range   pH, Ven 7.366 7.25 - 7.43   pCO2, Ven 70.7 (HH) 44 - 60 mmHg   pO2, Ven 52 (H) 32 - 45 mmHg   Bicarbonate 40.5 (H) 20.0 - 28.0 mmol/L   TCO2 43 (H) 22 - 32 mmol/L   O2 Saturation 84 %   Acid-Base Excess 11.0 (H) 0.0 - 2.0 mmol/L   Sodium 135 135 - 145 mmol/L   Potassium 4.9 3.5 - 5.1 mmol/L   Calcium, Ion 1.16 1.15 - 1.40 mmol/L   HCT 46.0 39.0 -  52.0 %   Hemoglobin 15.6 13.0 - 17.0 g/dL   Patient temperature 01.3 F    Collection site IV start    Drawn by Nurse    Sample type VENOUS    Comment NOTIFIED PHYSICIAN     DG Chest Port 1 View Result Date: 05/19/2024 CLINICAL DATA:  Shortness of breath. EXAM: PORTABLE CHEST 1 VIEW COMPARISON:  08/30/2018 and CT chest 12/02/2005. FINDINGS: Trachea is midline. Heart size normal. Lungs are clear. No pleural fluid. IMPRESSION: No acute findings. Electronically Signed   By: Newell Eke M.D.   On: 05/19/2024 14:23    MND:wzhjupcz other than stated per HPI  Blood pressure (!) 159/88, pulse 95, temperature 97.6 F (36.4 C), temperature source Oral, resp. rate 20, height 5' 9 (1.753 m), weight 59 kg, SpO2 98%.  PHYSICAL EXAM:  CONSTITUTIONAL: well developed, nourished, no distress and alert and oriented x 3 EYES: PERRL, EOMI  HENT: Head :  normocephalic and atraumatic Nose: nose normal and no purulence, mild left septal deviation Mouth/Throat:  Mouth: uvula midline and no oral lesions Throat: oropharynx clear and moist Neck: Prominent osteophytes of the sternoclavicular joints bilaterally with a low positioned larynx difficult to palpate the cricoid.  Bedside laryngoscopy: A flexible endoscope was passed to the right nasal cavity and the larynx was visualized.  Prominent anterior cervical osteophytes were seen protruding slightly into the airway at the level of the retinoids and the epiglottis.  There was some hypermobility of the vocal cords along with a moderate amount of vocal cord edema.  Studies Reviewed: CT from 2023 which showed large anterior cervical osteophytes at the level of the glottic inlet.  Updated CT from today was reviewed and although no report was available upon my independent review the findings at the level of the larynx and the cervical spine appear relatively stable compared to 2023.  Assessment/Plan:  Stridor - I suspect he likely had a respiratory infection exacerbating his baseline airway narrowing.  Laryngoscopy was compared to previous outside images from [facility] 6 weeks ago and appeared fairly stable with the exception of slightly more edema of the vocal cords and some slight hypomobility.  I discussed with the patient and family options of continued medical therapy with steroids, endotracheal intubation and tracheostomy.  I also discussed this with Dr. Shelah from critical care as it is unclear whether the patient wants to proceed with intubation if he worsens.  Additionally I discussed that his anatomy would be difficult for tracheostomy especially if done on an urgent or emergent basis.  Dysphagia - Chronic in nature, most likely due to his large anterior cervical osteophytes.  Presumably this is caused a significant medical weight loss however other causes of his weight loss may be contributing   Darlyn Fries MD [facility] ENT  05/19/2024, 5:21 PM

## 2024-05-19 NOTE — ED Notes (Signed)
 MD at bedside with patient

## 2024-05-19 NOTE — ED Notes (Signed)
 Patient and spouse both states patient is DNR and DNI. Chiropractor at bedside during discussion.

## 2024-05-19 NOTE — H&P (Signed)
 NAME:  Samuel Cortez, MRN:  [id number], DOB:  November 27, 1940, LOS: 0 ADMISSION DATE:  05/19/2024, CONSULTATION DATE:  05/19/24 REFERRING MD:  EDP, CHIEF COMPLAINT:  SOB   History of Present Illness:  83 year old man w/ hx of weight loss and dysphagia presenting with 2 weeks of worsening breathing, voice changes, and neck pain.  Arrived to [facility] found to have stridor, started on usual meds and sent by ambulance to [facility] for ENT evaluation.  Patient currently some mild hypoxemia and chronic CO2 retention on ABG.  PCCM to admit as patient states he is okay with intubation  if needed.  Pertinent  Medical History    Accompanied by slightly low total protein, discussed the need for protein supplementation, patient in agreement, question whether swallowing difficulties related to abnormal EMG nerve conduction studies.--weight stable since last office visit. reviewed CT of the abdomen and pelvis 07/2023 obtained for weight loss, and change in bowel habits reveals no acute findings masses or enlargement of the lymph nodes. There were is some calcifications and evidence of atherosclerosis in the heart and aorta along with some gallstones without evidence of infection. we will follow-up on renal parameters liver parameters question the need for imaging of the thoracic cavity  08/07/2023: Again significant 11 pound weight loss however patient believes that this is stabled stabilized over the last 2 to 3 weeks, discussed at length the need for high-calorie protein supplements, with her be over-the-counter or homemade mixtures of protein supplements, patient in agreement, discussed his inability to tolerate large amounts of solid food given restrictions with his throat, will follow-up on protein levels today but certainly concerned with other etiologies; Given changes in bowel habits, will obtain CT scan of the abdomen and pelvis. we did discuss previous workup at length including rheumatology workup, ENT to include direct  visualization of the throat, that was benign, speech therapy consult, workup by neurosurgery including Dr. Colon with thought process of encroachment of the cervical spine on the esophagus. However given changes in bowel habits, and continued weight loss despite best efforts, will will obtain abdominal and pelvic CT scan with oral contrast.  04/27/2023: Has lost another 6 pounds discussed, patient believes it is related to his postnasal drip and swallowing difficulties, will monitor. 03/06/2023: Ongoing weight loss, associated with swallowing difficulties, ENT evaluation pending 03/13/2023, has had swallow study as denoted previously, has no issues with solids but is having significant neck discomfort 11/09/2022:Stable with no change from 10/21/2021 ---Unclear etiology, patient does state that he has been eating less but not by intention just does not feel like it. Laboratory evaluation unremarkable, excessive phlegm production is concerning, follow-up chest x-ray, may need ENT consult no lymphadenopathy on exam or B-cell symptoms, we will follow-up in 3 to 4 months. Per patient no additional weight loss in the last 3 months.     Significant Hospital Events: Including procedures, antibiotic start and stop dates in addition to other pertinent events     Interim History / Subjective:  Admit  Objective    Blood pressure (!) 159/88, pulse 95, temperature 97.6 F (36.4 C), temperature source Oral, resp. rate 20, height 5' 9 (1.753 m), weight 59 kg, SpO2 98%.       No intake or output data in the 24 hours ending 05/19/24 1615 Filed Weights   05/19/24 1553  Weight: 59 kg    Examination: General: cachetic man in NAD HENT: some L clavicular fullness, trachea midline, no palpable masses, malampatti 0, + temporal  wasting Lungs: diminished with transmitted upper airway sounds, hoarse voice, bidrectional stridor around cords Cardiovascular: regular, ext warm Abdomen: soft, +BS Extremities: +muscle  wasting Neuro: moving everything to command Skin: no rashes  Resolved problem list   Assessment and Plan  Stridor in context of longstanding ill-explained dysphagia symptoms- ACDF thought possible encroaching on esophagus but not seen on MBSS.  Did have some retrograde flow through GE junction.  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  CT neck and chest Dexamethasone  Empiric unasyn  NPO ENT to review CT He states to me he would be okay with intubation if needed to save his life, code status adjusted Palliative consult  Labs   CBC: Recent Labs  Lab 05/19/24 1402 05/19/24 1416  WBC 13.2*  --   HGB 16.4 15.6  HCT 47.2 46.0  MCV 89.6  --   PLT 435*  --     Basic Metabolic Panel: Recent Labs  Lab 05/19/24 1402 05/19/24 1416  NA 136 135  K 3.8 4.9  CL 93*  --   CO2 28  --   GLUCOSE 131*  --   BUN 19  --   CREATININE 0.73  --   CALCIUM 10.3  --    GFR: Estimated Creatinine Clearance: 58.4 mL/min (by C-G formula based on SCr of 0.73 mg/dL). Recent Labs  Lab 05/19/24 1402  WBC 13.2*    Liver Function Tests: No results for input(s): AST, ALT, ALKPHOS, BILITOT, PROT, ALBUMIN  in the last 168 hours. No results for input(s): LIPASE, AMYLASE in the last 168 hours. No results for input(s): AMMONIA in the last 168 hours.  ABG    Component Value Date/Time   HCO3 40.5 (H) 05/19/2024 1416   TCO2 43 (H) 05/19/2024 1416   O2SAT 84 05/19/2024 1416     Coagulation Profile: No results for input(s): INR, PROTIME in the last 168 hours.  Cardiac Enzymes: No results for input(s): CKTOTAL, CKMB, CKMBINDEX, TROPONINI in the last 168 hours.  HbA1C: Hgb A1c MFr Bld  Date/Time Value Ref Range Status  10/11/2023 11:52 AM 5.6 4.8 - 5.6 % Final    Comment:             Prediabetes: 5.7 - 6.4          Diabetes: >6.4          Glycemic control for adults with diabetes: <7.0      CBG: No results for input(s): GLUCAP in the last 168 hours.  Review of Systems:    Positive Symptoms in bold:  Constitutional fevers, chills, weight loss, fatigue, anorexia, malaise  Eyes decreased vision, double vision, eye irritation  Ears, Nose, Mouth, Throat sore throat, trouble swallowing, sinus congestion  Cardiovascular chest pain, paroxysmal nocturnal dyspnea, lower ext edema, palpitations   Respiratory SOB, cough, DOE, hemoptysis, wheezing  Gastrointestinal nausea, vomiting, diarrhea  Genitourinary burning with urination, trouble urinating  Musculoskeletal joint aches, joint swelling, back pain  Integumentary  rashes, skin lesions  Neurological focal weakness, focal numbness, trouble speaking, headaches  Psychiatric depression, anxiety, confusion  Endocrine polyuria, polydipsia, cold intolerance, heat intolerance  Hematologic abnormal bruising, abnormal bleeding, unexplained nose bleeds  Allergic/Immunologic recurrent infections, hives, swollen lymph nodes     Past Medical History:  He,  has a past medical history of Gout and Hypertension.   Surgical History:   Past Surgical History:  Procedure Laterality Date   ANTERIOR CERVICAL DECOMP/DISCECTOMY FUSION N/A 08/31/2018   Procedure: ANTERIOR CERVICAL DECOMPRESSION/DISCECTOMY FUSION CERVICAL  FOUR-CERVICAL FIVE;  Surgeon: Unice Pac, MD;  Location: [facility] OR;  Service: Neurosurgery;  Laterality: N/A;  ANTERIOR CERVICAL DECOMPRESSION/DISCECTOMY FUSION CERVICAL FOUR-CERVICAL FIVE   TONSILLECTOMY       Social History:   reports that he has quit smoking. He has never used smokeless tobacco. He reports current alcohol  use of about 2.0 standard drinks of alcohol  per week. He reports that he does not use drugs.   Family History:  His family history is not on file.   Allergies Allergies  Allergen Reactions   Pollen Extract Other (See Comments)    Lightheadedness, runny nose and itchy eyes (trees and shrubs, also)    Adhesive [Tape] Rash     Home Medications  Prior to Admission medications   Medication Sig Start Date End Date Taking? Authorizing Provider  acetaminophen  (TYLENOL ) 500 MG tablet Take 500-1,000 mg by mouth every 6 (six) hours as needed (for pain). Patient not taking: Reported on 10/11/2023    [provider]  allopurinol  (ZYLOPRIM ) 300 MG tablet Take 300 mg by mouth daily. Patient not taking: Reported on 10/11/2023    [provider]  amLODipine  (NORVASC ) 2.5 MG tablet Take 2.5 mg by mouth daily.    [provider]  bimatoprost (LUMIGAN) 0.01 % SOLN Place 1 drop into both eyes at bedtime.    [provider]  loratadine (CLARITIN) 10 MG tablet Take 10 mg by mouth daily. 07/12/23   [provider]  meclizine  (ANTIVERT ) 12.5 MG tablet Take 1 tablet (12.5 mg total) by mouth 3 (three) times daily as needed for dizziness. Patient not taking: Reported on 10/11/2023 03/23/23   Randol Simmonds, MD  methocarbamol  (ROBAXIN ) 500 MG tablet Take 1 tablet (500 mg total) by mouth every 6 (six) hours as needed for muscle spasms. Patient not taking: Reported on 10/11/2023 09/01/18   Louis Shove, MD  ondansetron  (ZOFRAN -ODT) 8 MG disintegrating tablet Take 1 tablet (8 mg total) by mouth every 8 (eight) hours as needed for nausea or vomiting. Patient not taking: Reported on 10/11/2023 03/23/23   Randol Simmonds, MD  oxyCODONE  (OXY IR/ROXICODONE ) 5 MG immediate release tablet Take 1 tablet (5 mg total) by mouth every 3 (three) hours as needed for moderate pain ((score 4 to 6)). Patient not taking: Reported on 10/11/2023 09/01/18   Louis Shove, MD  predniSONE  (DELTASONE ) 20 MG tablet 20 mg 3 a day for 4 days 2 a day for 3 days 1 for 4 days Patient not taking: Reported on 10/11/2023 04/12/22   Livingston Rigg, MD  Propylene Glycol (SYSTANE BALANCE) 0.6 % SOLN Place 2 drops into both eyes as needed (for itching). Patient not taking: Reported on 10/11/2023    [provider]   telmisartan -hydrochlorothiazide  (MICARDIS  HCT) 40-12.5 MG tablet Take 1 tablet by mouth daily. Patient not taking: Reported on 10/11/2023    [provider]     Critical care time: 32 min

## 2024-05-19 NOTE — ED Provider Notes (Addendum)
 [place] EMERGENCY DEPARTMENT AT [facility] Provider Note   CSN: [id number] Arrival date & time: 05/19/24  1346     Patient presents with: Shortness of Breath   Samuel Cortez is a 83 y.o. male.   83 year old male with a history of ACDF C4/C5 in 2019 and chronic dysphagia who presents to the emergency department with difficulty breathing.  Patient has been followed for difficulty swallowing and throat pain after his spinal fusion.  Has had extensive workup that showed some cervical spurs impinging the hypopharynx and interfering with epiglottic inversion.  Had a thyroid  biopsy a month ago as well.  Has lost significant weight over the past year or 2 due to his difficulty swallowing.  Wife says that last night he started developing some shortness of breath and she thought he was going to die but he was too stubborn to bring him to the emergency department until today.  He denies any fevers or chills or infectious symptoms recently       Prior to Admission medications   Medication Sig Start Date End Date Taking? Authorizing Provider  acetaminophen  (TYLENOL ) 500 MG tablet Take 500-1,000 mg by mouth every 6 (six) hours as needed (for pain). Patient not taking: Reported on 10/11/2023    [provider]  allopurinol  (ZYLOPRIM ) 300 MG tablet Take 300 mg by mouth daily. Patient not taking: Reported on 10/11/2023    [provider]  amLODipine  (NORVASC ) 2.5 MG tablet Take 2.5 mg by mouth daily.    [provider]  bimatoprost (LUMIGAN) 0.01 % SOLN Place 1 drop into both eyes at bedtime.    [provider]  loratadine (CLARITIN) 10 MG tablet Take 10 mg by mouth daily. 07/12/23   [provider]  meclizine  (ANTIVERT ) 12.5 MG tablet Take 1 tablet (12.5 mg total) by mouth 3 (three) times daily as needed for dizziness. Patient not taking: Reported on 10/11/2023 03/23/23   Randol Simmonds, MD  methocarbamol  (ROBAXIN ) 500 MG tablet Take 1 tablet (500 mg  total) by mouth every 6 (six) hours as needed for muscle spasms. Patient not taking: Reported on 10/11/2023 09/01/18   Louis Shove, MD  ondansetron  (ZOFRAN -ODT) 8 MG disintegrating tablet Take 1 tablet (8 mg total) by mouth every 8 (eight) hours as needed for nausea or vomiting. Patient not taking: Reported on 10/11/2023 03/23/23   Randol Simmonds, MD  oxyCODONE  (OXY IR/ROXICODONE ) 5 MG immediate release tablet Take 1 tablet (5 mg total) by mouth every 3 (three) hours as needed for moderate pain ((score 4 to 6)). Patient not taking: Reported on 10/11/2023 09/01/18   Louis Shove, MD  predniSONE  (DELTASONE ) 20 MG tablet 20 mg 3 a day for 4 days 2 a day for 3 days 1 for 4 days Patient not taking: Reported on 10/11/2023 04/12/22   Livingston Rigg, MD  Propylene Glycol (SYSTANE BALANCE) 0.6 % SOLN Place 2 drops into both eyes as needed (for itching). Patient not taking: Reported on 10/11/2023    [provider]  telmisartan -hydrochlorothiazide  (MICARDIS  HCT) 40-12.5 MG tablet Take 1 tablet by mouth daily. Patient not taking: Reported on 10/11/2023    [provider]    Allergies: Pollen extract and Adhesive [tape]    Review of Systems  Updated Vital Signs BP (!) 159/82   Pulse 87   Temp 97.6 F (36.4 C) (Oral)   Resp 19   Ht 5' 9 (1.753 m)   SpO2 100%   BMI 22.30 kg/m   Physical Exam Vitals  and nursing note reviewed.  Constitutional:      General: He is in acute distress.     Appearance: He is well-developed. He is ill-appearing.  HENT:     Head: Normocephalic and atraumatic.     Right Ear: External ear normal.     Left Ear: External ear normal.     Nose: Nose normal.  Eyes:     Extraocular Movements: Extraocular movements intact.     Conjunctiva/sclera: Conjunctivae normal.     Pupils: Pupils are equal, round, and reactive to light.  Cardiovascular:     Rate and Rhythm: Normal rate and regular rhythm.     Heart sounds: Normal heart sounds.  Pulmonary:     Effort:  Respiratory distress present.     Breath sounds: Stridor present.  Musculoskeletal:     Cervical back: Normal range of motion and neck supple.  Skin:    General: Skin is warm and dry.  Neurological:     Mental Status: He is alert. Mental status is at baseline.  Psychiatric:        Mood and Affect: Mood normal.        Behavior: Behavior normal.     (all labs ordered are listed, but only abnormal results are displayed) Labs Reviewed  BASIC METABOLIC PANEL WITH GFR - Abnormal; Notable for the following components:      Result Value   Chloride 93 (*)    Glucose, Bld 131 (*)    All other components within normal limits  CBC - Abnormal; Notable for the following components:   WBC 13.2 (*)    Platelets 435 (*)    All other components within normal limits  PRO BRAIN NATRIURETIC PEPTIDE - Abnormal; Notable for the following components:   Pro Brain Natriuretic Peptide 1,036.0 (*)    All other components within normal limits  I-STAT VENOUS BLOOD GAS, ED - Abnormal; Notable for the following components:   pCO2, Ven 70.7 (*)    pO2, Ven 52 (*)    Bicarbonate 40.5 (*)    TCO2 43 (*)    Acid-Base Excess 11.0 (*)    All other components within normal limits  BLOOD GAS, VENOUS    EKG: EKG Interpretation Date/Time:  Sunday May 19 2024 13:57:16 EDT Ventricular Rate:  86 PR Interval:  207 QRS Duration:  122 QT Interval:  396 QTC Calculation: 474 R Axis:   -80  Text Interpretation: Sinus rhythm Right atrial enlargement RBBB and LAFB Nonspecific ST abnormality in inferior leads Confirmed by Yolande Charleston [phone]) on 05/19/2024 2:37:24 PM  Radiology: [facility] Chest Port 1 View Result Date: 05/19/2024 CLINICAL DATA:  Shortness of breath. EXAM: PORTABLE CHEST 1 VIEW COMPARISON:  08/30/2018 and CT chest 12/02/2005. FINDINGS: Trachea is midline. Heart size normal. Lungs are clear. No pleural fluid. IMPRESSION: No acute findings. Electronically Signed   By: Newell Eke M.D.   On: 05/19/2024  14:23     Procedures   Medications Ordered in the ED  Racepinephrine HCl 2.25 % nebulizer solution (  Not Given 05/19/24 1405)  Racepinephrine HCl 2.25 % nebulizer solution (  Not Given 05/19/24 1427)  iohexol  (OMNIPAQUE ) 300 MG/ML solution 100 mL (has no administration in time range)  Racepinephrine HCl 2.25 % nebulizer solution 0.5 mL (0.5 mLs Nebulization Given 05/19/24 1357)  dexamethasone  (DECADRON ) injection 10 mg (10 mg Intravenous Given 05/19/24 1445)  Racepinephrine HCl 2.25 % nebulizer solution 0.5 mL (0.5 mLs Nebulization Given 05/19/24 1426)    Clinical Course as  of 05/19/24 1447  Sun May 19, 2024  1414 Confirmed DNR/DNI with the patient and his wife [RP]  1432 Dr Maggie from ENT consulted.  Will follow along and recommends reaching back out after the imaging. [RP]  1433 Dr. Elnor accepts the patient for ED to ED transfer to [facility] [RP]  8560 Dr Claudene from ICU consulted for admission.  [RP]    Clinical Course User Index [RP] Yolande Samuel BROCKS, MD                                 Medical Decision Making Amount and/or Complexity of Data Reviewed Labs: ordered. Radiology: ordered.  Risk OTC drugs. Prescription drug management. Decision regarding hospitalization.   KASIR HALLENBECK is a 83 year old male with a history of ACDF C4/C5 in 2019 and chronic dysphagia who presents to the emergency department with difficulty breathing.   Initial Ddx:  Respiratory distress, upper airway obstruction, epiglottitis, bacterial tracheitis   MDM/Course:  Patient presents to the emergency department in respiratory distress.  Does have a history of dysphagia that appears to be as a result of his spinal fusion.  Does have some bone spurs that have previously interfered with epiglottic closing.  Has worsened over the past few days and over the past 24 hours has developed stridor and went into respiratory distress.  He is very ill-appearing on exam.  Does have severe stridor but is  satting well on room air.  No upper airway mass that I can visualize on exam.  He was given Decadron  and racemic epi multiple times.  Does appear to have elevated pCO2 but is not acidotic and has a very elevated bicarb so I suspect that this is likely chronic.  He is mentating well.  Will hold off on BiPAP for now to avoid worsening his upper airway edema. Confirmed DNR/DNI with the patient and his wife.  He does understand that this could be fatal.  Did reach out to ENT and critical care.  They are requesting the patient have a CT scan to be transferred to [facility].  After results of the CT scan ENT and critical care would like to have a three-way call with the ED provider to discuss the results and disposition/treatment since the patient says that he would potentially consider surgical intervention but does not want emergent intubation.  Accepted for transfer to the [facility] ED in the meantime to expedite his care. Upon re-evaluation is doing better after the racemic epi and decadron . Stridor has improved significantly.   This patient presents to the ED for concern of complaints listed in HPI, this involves an extensive number of treatment options, and is a complaint that carries with it a high risk of complications and morbidity. Disposition including potential need for admission considered.   Dispo: Pending remainder of workup  Additional history obtained from spouse Records reviewed Outpatient Clinic Notes The following labs were independently interpreted: Chemistry and show no acute abnormality I independently reviewed the following imaging with scope of interpretation limited to determining acute life threatening conditions related to emergency care: Chest x-ray and agree with the radiologist interpretation with the following exceptions: none I personally reviewed and interpreted cardiac monitoring: sinus tachycardia I personally reviewed and interpreted the pt's EKG: see above for  interpretation  I have reviewed the patients home medications and made adjustments as needed Consults: Critical care and ENT Social Determinants of health:  Geriatric  CRITICAL CARE Performed by: Samuel Cortez Shan   Total critical care time: 30 minutes  Critical care time was exclusive of separately billable procedures and treating other patients.  Critical care was necessary to treat or prevent imminent or life-threatening deterioration.  Critical care was time spent personally by me on the following activities: development of treatment plan with patient and/or surrogate as well as nursing, discussions with consultants, evaluation of patient's response to treatment, examination of patient, obtaining history from patient or surrogate, ordering and performing treatments and interventions, ordering and review of laboratory studies, ordering and review of radiographic studies, pulse oximetry and re-evaluation of patient's condition.   Portions of this note were generated with Scientist, clinical (histocompatibility and immunogenetics). Dictation errors may occur despite best attempts at proofreading.     Final diagnoses:  Stridor  Respiratory distress    ED Discharge Orders     None         Shan Samuel JAYSON, MD 05/19/24 1456    Shan Samuel JAYSON, MD 05/19/24 [phone]

## 2024-05-19 NOTE — ED Notes (Signed)
 ENT called this RN & is ETA to bedside 10 min.

## 2024-05-19 NOTE — ED Notes (Signed)
 CCMD called.

## 2024-05-19 NOTE — ED Triage Notes (Signed)
 Pt reports increased SOB x2 days. Pt has significant weight loss of the last few months. Pt has diminished lung sounds on R side with rhonchi and striderous sound in throat.

## 2024-05-19 NOTE — ED Triage Notes (Signed)
 PT tx from [facility] who originally came from home due to SOB X2 weeks that turned into stridor. Pt was treated w/ Decadron , EPI, Albuterol . [facility] VS 145/81 hr 90, 99% 4 liters GCS 15. EKG revealed R- bundle branch block . PT has a PIV 20g right forearm and has a DNR at bedside..Planned for tx due to hospital admission.

## 2024-05-19 NOTE — Progress Notes (Signed)
 [facility] Physician-Brief Progress Note Patient Name: Samuel Cortez DOB: 01-01-1941 MRN: [id number]   Date of Service  05/19/2024  HPI/Events of Note  83 year old male that presented with 3 weeks of worsening breathing, voice changes and neck pain in the setting of ACDF found to have stridor secondary to vocal cord edema from an upper respiratory tract infection.  Notified about hypertension in the 180s  eICU Interventions  Add as needed antihypertensives.  Holding home amlodipine    0301 -remains stridorous, had been increasingly delirious.  Family at bedside.  Now having some nausea.  Add Zofran  as needed.  Continues to have extremely tenuous airway but saturating appropriately and not obvious distress.  The more tachypneic he becomes, the worse the stridor is going to sound, no immediate change in plan  [phone] -patient became less responsive and hypercapnic.  Given anticipated difficult airway, anesthesia was called to bedside for fiberoptic awake intubation.  Discussed care with the patient's wife at bedside.  Ground team also at bedside for evaluation.  Intervention Category Intermediate Interventions: Hypertension - evaluation and management  Adysen Raphael 05/19/2024, 8:49 PM

## 2024-05-19 NOTE — Progress Notes (Signed)
 Suggested to RT that patient may benefit from a face tent vs. aerosol mask. Hospital currently out of the face tents.  Will continue with mask at this time and remind patient to keep it on.

## 2024-05-19 NOTE — Progress Notes (Signed)
 Notified Elink RN, John that patient was getting agitated, confused, and stridor sounding worse and requested that ground team come see him if available. No new orders, continue current care plan. Wife is at bedside.

## 2024-05-19 NOTE — ED Notes (Signed)
 ENT at bedside

## 2024-05-20 ENCOUNTER — Inpatient Hospital Stay (HOSPITAL_COMMUNITY)

## 2024-05-20 DIAGNOSIS — R0603 Acute respiratory distress: Secondary | ICD-10-CM

## 2024-05-20 DIAGNOSIS — R061 Stridor: Secondary | ICD-10-CM | POA: Diagnosis not present

## 2024-05-20 DIAGNOSIS — Z7189 Other specified counseling: Secondary | ICD-10-CM

## 2024-05-20 DIAGNOSIS — Z515 Encounter for palliative care: Secondary | ICD-10-CM

## 2024-05-20 LAB — BLOOD GAS, ARTERIAL
Acid-Base Excess: 3.4 mmol/L — ABNORMAL HIGH (ref 0.0–2.0)
Bicarbonate: 39.4 mmol/L — ABNORMAL HIGH (ref 20.0–28.0)
Drawn by: 252031
O2 Saturation: 99.5 %
Patient temperature: 36.7
pCO2 arterial: >123 mmHg (ref 32–48)
pH, Arterial: 7.08 — CL (ref 7.35–7.45)
pO2, Arterial: 135 mmHg — ABNORMAL HIGH (ref 83–108)

## 2024-05-20 LAB — POCT I-STAT 7, (LYTES, BLD GAS, ICA,H+H)
Acid-Base Excess: 11 mmol/L — ABNORMAL HIGH (ref 0.0–2.0)
Bicarbonate: 35.9 mmol/L — ABNORMAL HIGH (ref 20.0–28.0)
Calcium, Ion: 1.09 mmol/L — ABNORMAL LOW (ref 1.15–1.40)
HCT: 43 % (ref 39.0–52.0)
Hemoglobin: 14.6 g/dL (ref 13.0–17.0)
O2 Saturation: 100 %
Patient temperature: 98.6
Potassium: 4.3 mmol/L (ref 3.5–5.1)
Sodium: 136 mmol/L (ref 135–145)
TCO2: 37 mmol/L — ABNORMAL HIGH (ref 22–32)
pCO2 arterial: 46.3 mmHg (ref 32–48)
pH, Arterial: 7.498 — ABNORMAL HIGH (ref 7.35–7.45)
pO2, Arterial: 567 mmHg — ABNORMAL HIGH (ref 83–108)

## 2024-05-20 LAB — GLUCOSE, CAPILLARY
Glucose-Capillary: 160 mg/dL — ABNORMAL HIGH (ref 70–99)
Glucose-Capillary: 169 mg/dL — ABNORMAL HIGH (ref 70–99)
Glucose-Capillary: 158 mg/dL — ABNORMAL HIGH (ref 70–99)
Glucose-Capillary: 141 mg/dL — ABNORMAL HIGH (ref 70–99)
Glucose-Capillary: 138 mg/dL — ABNORMAL HIGH (ref 70–99)
Glucose-Capillary: 144 mg/dL — ABNORMAL HIGH (ref 70–99)

## 2024-05-20 MED ORDER — FENTANYL 2500MCG IN NS 250ML (10MCG/ML) PREMIX INFUSION
0.0000 ug/h | INTRAVENOUS | Status: DC
  Administered 2024-05-20: 25 ug/h via INTRAVENOUS
  Filled 2024-05-20: qty 250

## 2024-05-20 MED ORDER — SODIUM BICARBONATE 8.4 % IV SOLN: 50.0000 meq | Freq: Once | INTRAVENOUS | Status: AC

## 2024-05-20 MED ORDER — ONDANSETRON HCL 4 MG/2ML IJ SOLN
4.0000 mg | Freq: Four times a day (QID) | INTRAMUSCULAR | Status: DC | PRN
  Administered 2024-05-20 – 2024-05-23 (×2): 4 mg via INTRAVENOUS
  Filled 2024-05-20 (×2): qty 2

## 2024-05-20 MED ORDER — SODIUM CHLORIDE 0.9 % IV SOLN
250.0000 mL | INTRAVENOUS | Status: AC
  Administered 2024-05-20: 250 mL via INTRAVENOUS

## 2024-05-20 MED ORDER — SODIUM BICARBONATE 8.4 % IV SOLN
INTRAVENOUS | Status: AC
  Administered 2024-05-20: 50 meq via INTRAVENOUS
  Filled 2024-05-20: qty 50

## 2024-05-20 MED ORDER — ORAL CARE MOUTH RINSE
15.0000 mL | OROMUCOSAL | Status: DC
  Administered 2024-05-20 – 2024-05-21 (×11): 15 mL via OROMUCOSAL

## 2024-05-20 MED ORDER — ORAL CARE MOUTH RINSE: 15.0000 mL | OROMUCOSAL | Status: DC | PRN

## 2024-05-20 MED ORDER — FENTANYL BOLUS VIA INFUSION
25.0000 ug | INTRAVENOUS | Status: DC | PRN
  Administered 2024-05-20: 25 ug via INTRAVENOUS

## 2024-05-20 MED ORDER — FENTANYL CITRATE PF 50 MCG/ML IJ SOSY: 25.0000 ug | PREFILLED_SYRINGE | Freq: Once | INTRAMUSCULAR | Status: AC

## 2024-05-20 MED ORDER — ENOXAPARIN SODIUM 40 MG/0.4ML IJ SOSY
40.0000 mg | PREFILLED_SYRINGE | Freq: Every day | INTRAMUSCULAR | Status: DC
  Administered 2024-05-20 – 2024-05-21 (×2): 40 mg via SUBCUTANEOUS
  Filled 2024-05-20 (×2): qty 0.4

## 2024-05-20 MED ORDER — SODIUM BICARBONATE 8.4 % IV SOLN
INTRAVENOUS | Status: AC
  Filled 2024-05-20: qty 50

## 2024-05-20 MED ORDER — LACTATED RINGERS IV BOLUS
1000.0000 mL | Freq: Once | INTRAVENOUS | Status: AC
  Administered 2024-05-20: 1000 mL via INTRAVENOUS

## 2024-05-20 MED ORDER — POLYETHYLENE GLYCOL 3350 17 G PO PACK
17.0000 g | PACK | Freq: Every day | ORAL | Status: DC
  Administered 2024-05-21: 17 g
  Filled 2024-05-20: qty 1

## 2024-05-20 MED ORDER — NOREPINEPHRINE 4 MG/250ML-% IV SOLN
INTRAVENOUS | Status: AC
  Administered 2024-05-20: 2 ug/min via INTRAVENOUS
  Filled 2024-05-20: qty 250

## 2024-05-20 MED ORDER — SODIUM BICARBONATE 8.4 % IV SOLN
INTRAVENOUS | Status: AC
  Administered 2024-05-20: 50 meq
  Filled 2024-05-20: qty 50

## 2024-05-20 MED ORDER — NOREPINEPHRINE 4 MG/250ML-% IV SOLN
0.0000 ug/min | INTRAVENOUS | Status: DC
  Administered 2024-05-20: 6 ug/min via INTRAVENOUS
  Filled 2024-05-20: qty 250

## 2024-05-20 NOTE — Progress Notes (Signed)
 Interim CCM Progress Note Respiratory Infection exacerbating airway narrowing  Notified at 0610, patient now obtunded- Upon arrival to beside, patient not responding to painful stimuli. Patient agonally breathing. STAT ABG obtained showing severe respiratory acidosis- CO2 >123.  P: [name] MD  Notified Anesthesia at beside  Discussed with wife about clinical status change and needing intubation Anesthesia able to use fiber optics to place ETT despite patient having severe tracheal stenosis  Give patient 1 AMP of bicarb  Start on peripheral levophed   Obtain ABG within one hour  Fentanyl  gtt for sedation  Set RATE on vent at 28  STAT Chest X-ray  Continue GOC with wife    Sherlean Sharps AGACNP-BC   [facility] 05/20/2024, 7:11 AM  Please see [email or web address] for pager details.  From 7A-7P if no response, please call [phone]. After hours, please call ELink [phone].

## 2024-05-20 NOTE — Progress Notes (Signed)
 eLink Physician-Brief Progress Note Patient Name: Samuel Cortez DOB: [birth year] MRN: [id number]   Date of Service  05/20/2024  HPI/Events of Note  Notified about diminishing urine output, 6 cc in the past hour.  +1.4 L for the day.  Normal renal function at last check.  eICU Interventions  No intervention, consider initiation of tube feeds tomorrow     Intervention Category Intermediate Interventions: Oliguria - evaluation and management  Adamarie Izzo 05/20/2024, 11:39 PM

## 2024-05-20 NOTE — Plan of Care (Signed)
  Problem: Clinical Measurements: Goal: Respiratory complications will improve Outcome: Progressing   Problem: Nutrition: Goal: Adequate nutrition will be maintained Outcome: Progressing   Problem: Pain Managment: Goal: General experience of comfort will improve and/or be controlled Outcome: Progressing   Problem: Safety: Goal: Ability to remain free from injury will improve Outcome: Progressing   Problem: Skin Integrity: Goal: Risk for impaired skin integrity will decrease Outcome: Progressing

## 2024-05-20 NOTE — Progress Notes (Signed)
 NAME:  Samuel Cortez, MRN:  [id number], DOB:  09/07/1941, LOS: 1 ADMISSION DATE:  05/19/2024, CONSULTATION DATE:  05/19/24 REFERRING MD:  EDP, CHIEF COMPLAINT:  SOB   History of Present Illness:  83 year old man w/ hx of weight loss and dysphagia presenting with 2 weeks of worsening breathing, voice changes, and neck pain.  Arrived to [facility] found to have stridor, started on usual meds and sent by ambulance to [facility] for ENT evaluation.  Patient currently some mild hypoxemia and chronic CO2 retention on ABG.  PCCM to admit as patient states he is okay with intubation  if needed.  Pertinent  Medical History    Accompanied by slightly low total protein, discussed the need for protein supplementation, patient in agreement, question whether swallowing difficulties related to abnormal EMG nerve conduction studies.--weight stable since last office visit. reviewed CT of the abdomen and pelvis 07/2023 obtained for weight loss, and change in bowel habits reveals no acute findings masses or enlargement of the lymph nodes. There were is some calcifications and evidence of atherosclerosis in the heart and aorta along with some gallstones without evidence of infection. we will follow-up on renal parameters liver parameters question the need for imaging of the thoracic cavity  08/07/2023: Again significant 11 pound weight loss however patient believes that this is stabled stabilized over the last 2 to 3 weeks, discussed at length the need for high-calorie protein supplements, with her be over-the-counter or homemade mixtures of protein supplements, patient in agreement, discussed his inability to tolerate large amounts of solid food given restrictions with his throat, will follow-up on protein levels today but certainly concerned with other etiologies; Given changes in bowel habits, will obtain CT scan of the abdomen and pelvis. we did discuss previous workup at length including rheumatology workup, ENT to include direct  visualization of the throat, that was benign, speech therapy consult, workup by neurosurgery including Dr. Colon with thought process of encroachment of the cervical spine on the esophagus. However given changes in bowel habits, and continued weight loss despite best efforts, will will obtain abdominal and pelvic CT scan with oral contrast.  04/27/2023: Has lost another 6 pounds discussed, patient believes it is related to his postnasal drip and swallowing difficulties, will monitor. 03/06/2023: Ongoing weight loss, associated with swallowing difficulties, ENT evaluation pending 03/13/2023, has had swallow study as denoted previously, has no issues with solids but is having significant neck discomfort 11/09/2022:Stable with no change from 10/21/2021 ---Unclear etiology, patient does state that he has been eating less but not by intention just does not feel like it. Laboratory evaluation unremarkable, excessive phlegm production is concerning, follow-up chest x-ray, may need ENT consult no lymphadenopathy on exam or B-cell symptoms, we will follow-up in 3 to 4 months. Per patient no additional weight loss in the last 3 months.     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  Significant airway narrowing CT scan of the chest 8/31 faint right basilar groundglass, 4 mm right lower lobe pulmonary nodule, no overt consolidation or abnormality 9/1 >> ET intubated by anesthesia, 6.0 ETT  Interim History / Subjective:  Fentanyl  25 Norepinephrine  2  Objective    Blood pressure 102/70, pulse 78, temperature (!) 97.5 F (36.4 C), temperature  source Axillary, resp. rate (!) 24, height 5' 9 (1.753 m), weight 55.9 kg, SpO2 100%.    Vent Mode:  PRVC FiO2 (%):  [30 %-100 %] 100 % Set Rate:  [28 bmp] 28 bmp Vt Set:  [500 mL] 500 mL PEEP:  [5 cmH20] 5 cmH20 Plateau Pressure:  [22 cmH20] 22 cmH20   Intake/Output Summary (Last 24 hours) at 05/20/2024 0750 Last data filed at 05/20/2024 0600 Gross per 24 hour  Intake 300 ml  Output 450 ml  Net -150 ml   Filed Weights   05/19/24 1553 05/19/24 1800 05/20/24 0124  Weight: 59 kg 55.7 kg 55.9 kg    Examination: General: Cachectic man, ill-appearing, ventilated HENT: Left clavicular fullness, trachea midline, no lymphadenopathy palpated Lungs: Clear bilateral breath sounds Cardiovascular: Regular, no murmur Abdomen: Nondistended, positive bowel sounds Extremities: Also wasting Neuro: Sedated, unresponsive on fentanyl  25 Skin: No rash  Resolved problem list   Assessment and Plan  Stridor in context of longstanding ill-explained dysphagia symptoms-appears to be related to osteophytes and chronic airway narrowing now with superimposed edema seen on laryngoscopy, question infectious/URI Acute respiratory failure with hypercapnia due to the above -ET intubated for airway protection, 6.0 ETT - PRVC 8 cc/kg - Recheck ABG postintubation - Will assess for cuff leak going forward with steroids/time but suspect will need to have a conversation about elective tracheostomy.  Appreciate Dr. Bruce assistance - Continue dexamethasone  as ordered - Continue empiric Unasyn  for now, no evidence for abscess, epiglottitis, tonsillitis on CT  Shock, most likely due to sedating medications for intubation - Norepinephrine  started via PIV postintubation - LR bolus now - Temporize with bicarb  Goals for care - Confirmed with him that he would not want CPR, would not want significant escalation of care, prolonged ICU care but that he was okay with intubation to try to get over acute upper airway process - Will need to have further discussions with patient's family regarding whether tracheostomy,  PEG would be consistent with his wishes.  Based on our initial conversations it sounds like he would not want either.  Patient's wife and son updated at bedside 9/1  Labs   CBC: Recent Labs  Lab 05/19/24 1402 05/19/24 1416  WBC 13.2*  --   HGB 16.4 15.6  HCT 47.2 46.0  MCV 89.6  --   PLT 435*  --     Basic Metabolic Panel: Recent Labs  Lab 05/19/24 1402 05/19/24 1416  NA 136 135  K 3.8 4.9  CL 93*  --   CO2 28  --   GLUCOSE 131*  --   BUN 19  --   CREATININE 0.73  --   CALCIUM 10.3  --    GFR: Estimated Creatinine Clearance: 55.3 mL/min (by C-G formula based on SCr of 0.73 mg/dL). Recent Labs  Lab 05/19/24 1402  WBC 13.2*    Liver Function Tests: No results for input(s): AST, ALT, ALKPHOS, BILITOT, PROT, ALBUMIN  in the last 168 hours. No results for input(s): LIPASE, AMYLASE in the last 168 hours. No results for input(s): AMMONIA in the last 168 hours.  ABG    Component Value Date/Time   PHART 7.08 (LL) 05/20/2024 0630   PCO2ART >123 (HH) 05/20/2024 0630   PO2ART 135 (H) 05/20/2024 0630   HCO3 39.4 (H) 05/20/2024 0630   TCO2 43 (H) 05/19/2024 1416   O2SAT 99.5 05/20/2024 0630     Coagulation Profile: No results for input(s): INR, PROTIME in the last 168 hours.  Cardiac  Enzymes: No results for input(s): CKTOTAL, CKMB, CKMBINDEX, TROPONINI in the last 168 hours.  HbA1C: Hgb A1c MFr Bld  Date/Time Value Ref Range Status  10/11/2023 11:52 AM 5.6 4.8 - 5.6 % Final    Comment:             Prediabetes: 5.7 - 6.4          Diabetes: >6.4          Glycemic control for adults with diabetes: <7.0     CBG: Recent Labs  Lab 05/19/24 1737 05/19/24 1921 05/19/24 2324 05/20/24 0323 05/20/24 0748  GLUCAP 150* 135* 154* 160* 169*     Critical care time: 35 min     Lamar Chris, MD, PhD 05/20/2024, 7:50 AM [facility] [phone] or if no answer before 7:00PM call [phone] For any issues  after 7:00PM please call eLink [phone]

## 2024-05-20 NOTE — Anesthesia Procedure Notes (Signed)
 Procedure Name: Intubation Date/Time: 05/20/2024 6:45 AM  Performed by: Erma Thom SAUNDERS, MDPre-anesthesia Checklist: Emergency Drugs available, Patient identified, Suction available, Patient being monitored and Timeout performed Patient Re-evaluated:Patient Re-evaluated prior to induction Oxygen Delivery Method: Ambu bag Preoxygenation: Pre-oxygenation with 100% oxygen Grade View: Grade II Tube type: Oral Tube size: 6.0 mm Number of attempts: 1 Airway Equipment and Method: Fiberoptic brochoscope Placement Confirmation: ETT inserted through vocal cords under direct vision, positive ETCO2, CO2 detector and breath sounds checked- equal and bilateral Secured at: 25 cm Tube secured with: Tape Difficulty Due To: Difficulty was anticipated Future Recommendations: Recommend- awake intubation Comments: Called to bedside for intubation by ICU team. Upon arrival, patient is on face shield and unresponsive. Sats in mid 90s. Blood gas obtain with pCO2 >100.   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   Decision made to perform awake fiberoptic. AMBU scope used with 6.0 ETT loaded over. No drugs given IV or topical. Patient able to tolerate fiberoptic without gag response in the setting of CO2 narcosis.   Fiberoptic utilized to pass vocal cords and enter trachea. Once carina clearly identified, ETT passed smoothly over scope. Fiberoptic then removed and patient was attached to ventilator. CO2 color change +, and BBS+

## 2024-05-20 NOTE — Consult Note (Signed)
 Consultation Note Date: 05/20/2024   Patient Name: Samuel Cortez  DOB: [birth year]  MRN: [id number]  Age / Sex: 83 y.o., male  PCP: Samuel Santos, MD Referring Physician: Shelah Lamar RAMAN, MD  Reason for Consultation: Establishing goals of care  HPI/Patient Profile: 83 y.o. male  with past medical history of weight loss and dysphagia admitted on 05/19/2024 with 2 weeks of worsening breathing, voice changes, and neck pain.   Patient initially presented to [facility] then transferred [facility] for admission and potential intubation with diagnosis of stridor. His respiratory status worsened overnight with acute respiratory failure with hypercapnia, shock requiring pressors likely due to sedating medications for intubation. PMT has been consulted to assist with goals of care conversation.  Clinical Assessment and Goals of Care:  I have reviewed medical records including EPIC notes, labs and imaging, discussed with RN, assessed the patient and then met at the bedside with patient's son to discuss diagnosis prognosis, GOC, EOL wishes, disposition and options.  I introduced Palliative Medicine as specialized medical care for people living with serious illness. It focuses on providing relief from the symptoms and stress of a serious illness. The goal is to improve quality of life for both the patient and the family.  We discussed a brief life review of the patient and then focused on their current illness.   I attempted to elicit values and goals of care important to the patient.    Medical History Review and Understanding:  We discussed patient's acute illness in the context of their chronic comorbidities. Son and son's partner understand the severity of patient's illness.   Social History: Patient is married to [name] and has a stepson who lives in [place] , a local son, and daughter who lives in [place]. He lost his first wife in 2014 to Alzheimers.  He has also  lost a daughter with cerebral palsy, another son, and a few years ago he lost his mother.  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 He is described as having dry humor and enjoying hunting.  Functional and Nutritional State: Patient has lost substantial amount of weight due to dysphagia.   Advance Directives: No documentation is currently on file.  Discussion: Patient's son is understandably emotional at the bedside as they were just speaking on Friday patient was in great spirits seemingly doing well.  He states that he knew this would come one day, but not so soon.  He plans to inform patient's other children, siblings, friends.  He is appreciative for the extra layer of support and has no other major questions or concerns at this time.  I attempted to call patient's wife but was unable to reach. Left voicemail with PMT contact information. Per RN, plan is tentatively to see how patient does over the next 2-3 days and potentially transition to comfort care if he does not improve.  Discussed the importance of continued conversation with family and the medical providers regarding overall plan of care and treatment options, ensuring decisions are within the context of the patient's values and GOCs.   Questions and concerns were addressed.  Hard Choices booklet left for review. The family was encouraged to call with questions or concerns.  PMT will continue to support holistically.    SUMMARY OF RECOMMENDATIONS   - Continue DNI - Continue full scope otherwise and allow more time for outcomes - Ongoing GOC discussions -  Psychosocial and emotional support provided - PMT will continue to follow and support   Prognosis:  Unable to determine  Discharge Planning: To Be Determined      Primary Diagnoses: Present on Admission:  Stridor   Physical Exam Vitals and nursing  note reviewed.  Constitutional:      General: He is not in acute distress.    Interventions: He is intubated.  HENT:     Head: Normocephalic and atraumatic.  Cardiovascular:     Rate and Rhythm: Normal rate.  Pulmonary:     Effort: He is intubated.  Neurological:     Comments: Sedated     Vital Signs: BP (!) 88/58   Pulse 68   Temp 98.6 F (37 C) (Axillary)   Resp (!) 34   Ht 5' 9 (1.753 m)   Wt 55.9 kg   SpO2 100%   BMI 18.20 kg/m  Pain Scale: 0-10   Pain Score: 0-No pain   SpO2: SpO2: 100 % O2 Device:SpO2: 100 % O2 Flow Rate: .O2 Flow Rate (L/min): 6 L/min   MDM: high   Aymee Fomby SHAUNNA Fell, PA-C  Palliative Medicine Team Team phone # [phone]  Thank you for allowing the Palliative Medicine Team to assist in the care of this patient. Please utilize secure chat with additional questions, if there is no response within 30 minutes please call the above phone number.  Palliative Medicine Team providers are available by phone from 7am to 7pm daily and can be reached through the team cell phone.  Should this patient require assistance outside of these hours, please call the patient's attending physician.

## 2024-05-20 DEATH — deceased

## 2024-05-21 DIAGNOSIS — N179 Acute kidney failure, unspecified: Secondary | ICD-10-CM | POA: Diagnosis not present

## 2024-05-21 DIAGNOSIS — R061 Stridor: Secondary | ICD-10-CM | POA: Diagnosis not present

## 2024-05-21 DIAGNOSIS — I10 Essential (primary) hypertension: Secondary | ICD-10-CM

## 2024-05-21 LAB — RENAL FUNCTION PANEL
Albumin: 3.3 g/dL — ABNORMAL LOW (ref 3.5–5.0)
Anion gap: 13 (ref 5–15)
BUN: 56 mg/dL — ABNORMAL HIGH (ref 8–23)
CO2: 35 mmol/L — ABNORMAL HIGH (ref 22–32)
Calcium: 9.6 mg/dL (ref 8.9–10.3)
Chloride: 95 mmol/L — ABNORMAL LOW (ref 98–111)
Creatinine, Ser: 2.48 mg/dL — ABNORMAL HIGH (ref 0.61–1.24)
GFR, Estimated: 25 mL/min — ABNORMAL LOW (ref >60)
Glucose, Bld: 131 mg/dL — ABNORMAL HIGH (ref 70–99)
Phosphorus: 5.3 mg/dL — ABNORMAL HIGH (ref 2.5–4.6)
Potassium: 4.4 mmol/L (ref 3.5–5.1)
Sodium: 143 mmol/L (ref 135–145)

## 2024-05-21 LAB — CBC WITH DIFFERENTIAL/PLATELET
Abs Immature Granulocytes: 0.03 K/uL (ref 0.00–0.07)
Basophils Absolute: 0 K/uL (ref 0.0–0.1)
Basophils Relative: 0 %
Eosinophils Absolute: 0 K/uL (ref 0.0–0.5)
Eosinophils Relative: 0 %
HCT: 40.2 % (ref 39.0–52.0)
Hemoglobin: 13.8 g/dL (ref 13.0–17.0)
Immature Granulocytes: 0 %
Lymphocytes Relative: 6 %
Lymphs Abs: 0.5 K/uL — ABNORMAL LOW (ref 0.7–4.0)
MCH: 30.8 pg (ref 26.0–34.0)
MCHC: 34.3 g/dL (ref 30.0–36.0)
MCV: 89.7 fL (ref 80.0–100.0)
Monocytes Absolute: 1 K/uL (ref 0.1–1.0)
Monocytes Relative: 11 %
Neutro Abs: 7.7 K/uL (ref 1.7–7.7)
Neutrophils Relative %: 83 %
Platelets: 438 K/uL — ABNORMAL HIGH (ref 150–400)
RBC: 4.48 MIL/uL (ref 4.22–5.81)
RDW: 14.2 % (ref 11.5–15.5)
WBC: 9.3 K/uL (ref 4.0–10.5)
nRBC: 0 % (ref 0.0–0.2)

## 2024-05-21 LAB — GLUCOSE, CAPILLARY
Glucose-Capillary: 160 mg/dL — ABNORMAL HIGH (ref 70–99)
Glucose-Capillary: 135 mg/dL — ABNORMAL HIGH (ref 70–99)
Glucose-Capillary: 125 mg/dL — ABNORMAL HIGH (ref 70–99)
Glucose-Capillary: 120 mg/dL — ABNORMAL HIGH (ref 70–99)
Glucose-Capillary: 123 mg/dL — ABNORMAL HIGH (ref 70–99)
Glucose-Capillary: 111 mg/dL — ABNORMAL HIGH (ref 70–99)

## 2024-05-21 LAB — BASIC METABOLIC PANEL WITH GFR
Anion gap: 19 — ABNORMAL HIGH (ref 5–15)
BUN: 34 mg/dL — ABNORMAL HIGH (ref 8–23)
CO2: 28 mmol/L (ref 22–32)
Calcium: 9.3 mg/dL (ref 8.9–10.3)
Chloride: 93 mmol/L — ABNORMAL LOW (ref 98–111)
Creatinine, Ser: 1.97 mg/dL — ABNORMAL HIGH (ref 0.61–1.24)
GFR, Estimated: 33 mL/min — ABNORMAL LOW (ref >60)
Glucose, Bld: 159 mg/dL — ABNORMAL HIGH (ref 70–99)
Potassium: 3.5 mmol/L (ref 3.5–5.1)
Sodium: 140 mmol/L (ref 135–145)

## 2024-05-21 LAB — MAGNESIUM: Magnesium: 2.2 mg/dL (ref 1.7–2.4)

## 2024-05-21 MED ORDER — LATANOPROST 0.005 % OP SOLN
1.0000 [drp] | Freq: Every day | OPHTHALMIC | Status: DC
  Administered 2024-05-21 – 2024-05-23 (×3): 1 [drp] via OPHTHALMIC
  Filled 2024-05-21 (×2): qty 2.5

## 2024-05-21 MED ORDER — ORAL CARE MOUTH RINSE: 15.0000 mL | OROMUCOSAL | Status: DC | PRN

## 2024-05-21 MED ORDER — SODIUM CHLORIDE 0.9 % IV SOLN
3.0000 g | Freq: Two times a day (BID) | INTRAVENOUS | Status: DC
  Administered 2024-05-21 – 2024-05-23 (×4): 3 g via INTRAVENOUS
  Filled 2024-05-21 (×4): qty 8

## 2024-05-21 MED ORDER — PANTOPRAZOLE SODIUM 40 MG IV SOLR
40.0000 mg | INTRAVENOUS | Status: DC
  Administered 2024-05-21: 40 mg via INTRAVENOUS
  Filled 2024-05-21 (×2): qty 10

## 2024-05-21 MED ORDER — POTASSIUM CHLORIDE 20 MEQ PO PACK
40.0000 meq | PACK | Freq: Once | ORAL | Status: AC
  Administered 2024-05-21: 40 meq
  Filled 2024-05-21: qty 2

## 2024-05-21 MED ORDER — ORAL CARE MOUTH RINSE
15.0000 mL | OROMUCOSAL | Status: DC
  Administered 2024-05-21 – 2024-05-22 (×9): 15 mL via OROMUCOSAL

## 2024-05-21 MED ORDER — LACTATED RINGERS IV SOLN: INTRAVENOUS | Status: AC

## 2024-05-21 MED ORDER — HEPARIN SODIUM (PORCINE) 5000 UNIT/ML IJ SOLN
5000.0000 [IU] | Freq: Three times a day (TID) | INTRAMUSCULAR | Status: DC
  Administered 2024-05-22 – 2024-05-23 (×5): 5000 [IU] via SUBCUTANEOUS
  Filled 2024-05-21 (×6): qty 1

## 2024-05-21 NOTE — TOC CM/SW Note (Addendum)
 Transition of Care [facility]) - Inpatient Brief Assessment   Patient Details  Name: Samuel Cortez MRN: [id number] Date of Birth: 07-19-1941  [facility]) CM/SW Contact:    Lauraine FORBES Saa, LCSWA Phone Number: 05/21/2024, 9:08 AM   Clinical Narrative:  9:08 AM Per chart review, patient resides at home with spouse. Patient has a PCP and insurance. Patient does not have SNF or DME history. Patient has HH history with [facility]. Patient's preferred pharmacy is [facility] is [phone] [facility]. Patient is currently intubated with OG tube. No TOC needs were identified at this time. TOC will continue to follow and be available to assist.  Transition of Care Asessment: Insurance and Status: Insurance coverage has been reviewed Patient has primary care physician: Yes Home environment has been reviewed: Private Residence Prior level of function:: N/A Prior/Current Home Services: No current home services Social Drivers of Health Review: SDOH reviewed no interventions necessary Readmission risk has been reviewed: Yes (Currently Green 12%) Transition of care needs: no transition of care needs at this time

## 2024-05-21 NOTE — Progress Notes (Signed)
 NAME:  Samuel Cortez, MRN:  [id number], DOB:  1941-05-23, LOS: 2 ADMISSION DATE:  05/19/2024, CONSULTATION DATE:  05/19/24 REFERRING MD:  EDP, CHIEF COMPLAINT:  SOB   History of Present Illness:  83 year old man w/ hx of weight loss and dysphagia presenting with 2 weeks of worsening breathing, voice changes, and neck pain.  Arrived to [facility] found to have stridor, started on usual meds and sent by ambulance to [facility] for ENT evaluation.  Patient currently some mild hypoxemia and chronic CO2 retention on ABG.  PCCM to admit as patient states he is okay with intubation  if needed.  Pertinent  Medical History    Accompanied by slightly low total protein, discussed the need for protein supplementation, patient in agreement, question whether swallowing difficulties related to abnormal EMG nerve conduction studies.--weight stable since last office visit. reviewed CT of the abdomen and pelvis 07/2023 obtained for weight loss, and change in bowel habits reveals no acute findings masses or enlargement of the lymph nodes. There were is some calcifications and evidence of atherosclerosis in the heart and aorta along with some gallstones without evidence of infection. we will follow-up on renal parameters liver parameters question the need for imaging of the thoracic cavity  08/07/2023: Again significant 11 pound weight loss however patient believes that this is stabled stabilized over the last 2 to 3 weeks, discussed at length the need for high-calorie protein supplements, with her be over-the-counter or homemade mixtures of protein supplements, patient in agreement, discussed his inability to tolerate large amounts of solid food given restrictions with his throat, will follow-up on protein levels today but certainly concerned with other etiologies; Given changes in bowel habits, will obtain CT scan of the abdomen and pelvis. we did discuss previous workup at length including rheumatology workup, ENT to include direct  visualization of the throat, that was benign, speech therapy consult, workup by neurosurgery including Dr. Colon with thought process of encroachment of the cervical spine on the esophagus. However given changes in bowel habits, and continued weight loss despite best efforts, will will obtain abdominal and pelvic CT scan with oral contrast.  04/27/2023: Has lost another 6 pounds discussed, patient believes it is related to his postnasal drip and swallowing difficulties, will monitor. 03/06/2023: Ongoing weight loss, associated with swallowing difficulties, ENT evaluation pending 03/13/2023, has had swallow study as denoted previously, has no issues with solids but is having significant neck discomfort 11/09/2022:Stable with no change from 10/21/2021 ---Unclear etiology, patient does state that he has been eating less but not by intention just does not feel like it. Laboratory evaluation unremarkable, excessive phlegm production is concerning, follow-up chest x-ray, may need ENT consult no lymphadenopathy on exam or B-cell symptoms, we will follow-up in 3 to 4 months. Per patient no additional weight loss in the last 3 months.     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  Significant airway narrowing CT scan of the chest 8/31 faint right basilar groundglass, 4 mm right lower lobe pulmonary nodule, no overt consolidation or abnormality 9/1 >> ET intubated by anesthesia, 6.0 ETT. PMT consulted  Interim History / Subjective:  Off levophed  Awake and alert and following commands Objective    Blood pressure 136/79, pulse 71, temperature 98.5  F (36.9 C), temperature source Axillary, resp. rate 14, height 5' 9 (1.753 m), weight 57.8 kg, SpO2  97%.    Vent Mode: PRVC FiO2 (%):  [40 %] 40 % Set Rate:  [20 bmp] 20 bmp Vt Set:  [500 mL] 500 mL PEEP:  [5 cmH20] 5 cmH20 Plateau Pressure:  [16 cmH20-18 cmH20] 18 cmH20   Intake/Output Summary (Last 24 hours) at 05/21/2024 0910 Last data filed at 05/21/2024 0600 Gross per 24 hour  Intake 2077.11 ml  Output 472 ml  Net 1605.11 ml   Filed Weights   05/19/24 1800 05/20/24 0124 05/21/24 0500  Weight: 55.7 kg 55.9 kg 57.8 kg   Physical Exam: General: Chronically ill-appearing, sedated HENT: [place], AT, ETT in place  Eyes: EOMI, no scleral icterus Respiratory: No stridor. Clear to auscultation bilaterally.  No crackles, wheezing or rales Cardiovascular: RRR, -M/R/G, no JVD GI: BS+, soft, nontender Extremities:-Edema,-tenderness Neuro: Awake alert and following commands. CNII-XII grossly intact GU: Foley in place   Imaging, labs and test in EMR in the last 24 hours reviewed independently by me. Pertinent findings below: BUN/Cr 34/1.97  Resolved problem list   Assessment and Plan  Stridor in context of longstanding ill-explained dysphagia symptoms-appears to be related to osteophytes and chronic airway narrowing now with superimposed edema seen on laryngoscopy, question infectious/URI Acute respiratory failure with hypercapnia due to the above -Intubated for airway protection, 6.0 ETT -Full vent support -LTVV, 4-8cc/kg IBW with goal Pplat<30 and DP<15 -SBT. May be candidate for extubation today. -Continue dexamethasone  x 72 hours. Appreciate ENT input -Continue empiric Unasyn  x 3 days, no evidence for abscess, epiglottitis, tonsillitis on CT  Shock, most likely due to sedating medications for intubation. Resolved -Off Norepinephrine   AKI in setting of shock. Decreased UOP -Monitor UOP/Cr -Start LR @75cc /h x 18 hours  HTN -Restart home amlodipine  as able  Goals for care - Confirmed with him that he would not want CPR, would not want significant escalation of care,  prolonged ICU care but that he was okay with intubation to try to get over acute upper airway process -Palliative following  Updated wife and son on 9/2  Labs   CBC: Recent Labs  Lab 05/19/24 1402 05/19/24 1416 05/20/24 0810 05/21/24 0250  WBC 13.2*  --   --  9.3  NEUTROABS  --   --   --  7.7  HGB 16.4 15.6 14.6 13.8  HCT 47.2 46.0 43.0 40.2  MCV 89.6  --   --  89.7  PLT 435*  --   --  438*    Basic Metabolic Panel: Recent Labs  Lab 05/19/24 1402 05/19/24 1416 05/20/24 0810 05/21/24 0250  NA 136 135 136 140  K 3.8 4.9 4.3 3.5  CL 93*  --   --  93*  CO2 28  --   --  28  GLUCOSE 131*  --   --  159*  BUN 19  --   --  34*  CREATININE 0.73  --   --  1.97*  CALCIUM 10.3  --   --  9.3   GFR: Estimated Creatinine Clearance: 23.2 mL/min (A) (by C-G formula based on SCr of 1.97 mg/dL (H)). Recent Labs  Lab 05/19/24 1402 05/21/24 0250  WBC 13.2* 9.3    Liver Function Tests: No results for input(s): AST, ALT, ALKPHOS, BILITOT, PROT, ALBUMIN  in the last 168 hours. No results for input(s): LIPASE, AMYLASE in the last 168 hours. No results for input(s): AMMONIA in the last  168 hours.  ABG    Component Value Date/Time   PHART 7.498 (H) 05/20/2024 0810   PCO2ART 46.3 05/20/2024 0810   PO2ART 567 (H) 05/20/2024 0810   HCO3 35.9 (H) 05/20/2024 0810   TCO2 37 (H) 05/20/2024 0810   O2SAT 100 05/20/2024 0810     Coagulation Profile: No results for input(s): INR, PROTIME in the last 168 hours.  Cardiac Enzymes: No results for input(s): CKTOTAL, CKMB, CKMBINDEX, TROPONINI in the last 168 hours.  HbA1C: Hgb A1c MFr Bld  Date/Time Value Ref Range Status  10/11/2023 11:52 AM 5.6 4.8 - 5.6 % Final    Comment:             Prediabetes: 5.7 - 6.4          Diabetes: >6.4          Glycemic control for adults with diabetes: <7.0     CBG: Recent Labs  Lab 05/20/24 1518 05/20/24 1927 05/20/24 2327 05/21/24 0333 05/21/24 0734  GLUCAP  141* 138* 144* 160* 135*     Critical care time: 38 min     The patient is critically ill with multiple organ systems failure and requires high complexity decision making for assessment and support, frequent evaluation and titration of therapies, application of advanced monitoring technologies and extensive interpretation of multiple databases.  Independent Critical Care Time: 38 Minutes.   Slater Staff, M.D. [facility] Pulmonary/Critical Care Medicine 05/21/2024 9:10 AM   Please see Amion for pager number to reach on-call Pulmonary and Critical Care Team.

## 2024-05-21 NOTE — IPAL (Signed)
  Interdisciplinary Goals of Care Family Meeting   Date carried out: 05/21/2024  Location of the meeting: Bedside  Member's involved: Physician, Bedside Registered Nurse, and Family Member or next of kin  Durable Power of Attorney or acting medical decision maker: Patient (via whiteboard) and wife.     Discussion: We discussed goals of care for Samuel Cortez .  We discussed one way extubation. Patient does not want to be reintubated. Patient does not want to pursue tracheostomy. If he were to clinically decline, he agrees to pursue inpatient hospice.  Code status:   Code Status: Do not attempt resuscitation (DNR) PRE-ARREST INTERVENTIONS DESIRED   Disposition: Continue current acute care  Time spent for the meeting: 20 min    Markas Aldredge Slater Staff, MD  05/21/2024, 3:06 PM

## 2024-05-21 NOTE — Progress Notes (Signed)
 PHARMACY NOTE:  ANTIMICROBIAL RENAL DOSAGE ADJUSTMENT  Current antimicrobial regimen includes a mismatch between antimicrobial dosage and estimated renal function.  As per policy approved by the Pharmacy & Therapeutics and Medical Executive Committees, the antimicrobial dosage will be adjusted accordingly.  Current antimicrobial dosage:  Unasyn  3g IV q6h  Indication: Aspiration pneumonia  Renal Function:  Estimated Creatinine Clearance: 23.2 mL/min (A) (by C-G formula based on SCr of 1.97 mg/dL (H)). []      On intermittent HD, scheduled: []      On CRRT    Antimicrobial dosage has been changed to:  Unasyn  3g IV q12h  Additional comments:   Thank you for allowing pharmacy to be a part of this patient's care.  Harlene Boga, PharmD, BCPS, BCCCP Clinical Pharmacist Please refer to [facility] for [facility] Pharmacy numbers 05/21/2024 8:56 AM

## 2024-05-21 NOTE — Procedures (Signed)
 Extubation Procedure Note  Patient Details:   Name: Samuel Cortez DOB: 1940/11/24 MRN: [id number]   Airway Documentation:    Vent end date: 05/21/24 Vent end time: 1522   Evaluation  O2 sats: stable throughout Complications: No apparent complications Patient did tolerate procedure well. Bilateral Breath Sounds: Clear   Yes  Patient was extubated to a 4L [place] without any complications, dyspnea or stridor noted. Positive cuff leak noted prior to extubation.    Samuel Cortez, Samuel Cortez 05/21/2024, 3:22 PM

## 2024-05-21 NOTE — Progress Notes (Signed)
 Initial Nutrition Assessment  DOCUMENTATION CODES:  Severe malnutrition in context of chronic illness  INTERVENTION:  If unable to extubate or nutrition support is indicated, recommend: Osmolite 1.5 at 79ml/hr and advance by 10ml q12h to goal rate of 6ml/hr ( per day) 60ml ProSource TF20 once daily Provides 1700 kcal, 88g protein, and free water daily  Monitor magnesium and phosphorus daily x 4 occurrences, MD to replete as needed, as pt is at risk for refeeding syndrome given severe malnutrition and inadequate oral intake.  If extubated, recommend regular diet.  Consider SLP evaluation to assess necessity for modified texture diet.  Consider Cortrak placement for supplemental nutrition support if aligns with goals of care and inability to resume oral diet.  MVI with minerals daily  NUTRITION DIAGNOSIS:  Severe Malnutrition related to chronic illness, dysphagia as evidenced by severe fat depletion, severe muscle depletion, percent weight loss.  GOAL:  Patient will meet greater than or equal to 90% of their needs  MONITOR:  Vent status, Labs, Weight trends  REASON FOR ASSESSMENT:  Consult, Ventilator Enteral/tube feeding initiation and management  ASSESSMENT:  Pt admitted with c/o weight loss, dysphagia and 2 weeks of worsening breathing, voice changes and neck pain. PMH significant for spinal cord injury (2019).   Patient is currently intubated on ventilator support MV: 8.6 L/min Temp (24hrs), Avg:98.7 F (37.1 C), Min:97.4 F (36.3 C), Max:99.9 F (37.7 C)  Spoke with MD. Hold on initiation of tube feeding today given plan to extubate today. Would recommend consideration of Cortrak nutrition if unable to advance diet post extubation given severe malnutrition and chronic dysphagia.   Per interdisciplinary goals of care family meeting today, plan for one way extubation with plan to pursue inpatient hospice if pt is to clinically decline.   Pt awake and alert  at time of visit. Nods yes to poor oral intake and weight loss. Additional nutrition related history limited as family present and pt making basic needs known.   There is very limited weight history on file to review within the last year. Within the last 8 months, pt is noted have lost 15.6% weight loss which is clinically significant for time frame.   Drains/lines: OGT: x24 hours UOP: x24 hours  Medications: decadron  q6h, miralax  daily  Labs:  BUN 34 Cr 1.97 Anion gap 19 GFR 33 CBG's 135-160 x24 hours  NUTRITION - FOCUSED PHYSICAL EXAM: Flowsheet Row Most Recent Value  Orbital Region Severe depletion  Upper Arm Region Severe depletion  Thoracic and Lumbar Region Severe depletion  Buccal Region Severe depletion  Temple Region Severe depletion  Clavicle Bone Region Severe depletion  Clavicle and Acromion Bone Region Severe depletion  Scapular Bone Region Severe depletion  Dorsal Hand Severe depletion  Patellar Region Severe depletion  Anterior Thigh Region Severe depletion  Posterior Calf Region Severe depletion  Edema (RD Assessment) None  Hair Reviewed  Eyes Reviewed  Mouth Unable to assess  Skin Reviewed  Nails Reviewed    Diet Order:   Diet Order             Diet NPO time specified  Diet effective now                   EDUCATION NEEDS:  Not appropriate for education at this time  Skin:  Skin Assessment: Reviewed RN Assessment  Last BM:  unknown  Height:  Ht Readings from Last 1 Encounters:  05/19/24 5' 9 (1.753 m)    Weight:  Wt Readings from Last 1 Encounters:  05/21/24 57.8 kg   BMI:  Body mass index is 18.82 kg/m.  Estimated Nutritional Needs:   Kcal:  1600-1800  Protein:  80-95g  Fluid:  >/=1.6L  Samuel Cortez, RDN, LDN Clinical Nutrition See AMiON for contact information.

## 2024-05-21 NOTE — Progress Notes (Signed)
 [facility] ADULT ICU REPLACEMENT PROTOCOL   The patient does apply for the [facility] Adult ICU Electrolyte Replacment Protocol based on the criteria listed below:   1.Exclusion criteria: TCTS, ECMO, Dialysis, and Myasthenia Gravis patients 2. Is GFR >/= 30 ml/min? Yes.    Patient's GFR today is 33 3. Is SCr </= 2? Yes.   Patient's SCr is 1.97 mg/dL 4. Did SCr increase >/= 0.5 in 24 hours? No. 5.Pt's weight >40kg  Yes.   6. Abnormal electrolyte(s): potassium 3.5  7. Electrolytes replaced per protocol 8.  Call MD STAT for K+ </= 2.5, Phos </= 1, or Mag </= 1 Physician:  protocol  Claretta JINNY Sharps 05/21/2024 3:47 AM

## 2024-05-22 DIAGNOSIS — N179 Acute kidney failure, unspecified: Secondary | ICD-10-CM | POA: Diagnosis not present

## 2024-05-22 DIAGNOSIS — I10 Essential (primary) hypertension: Secondary | ICD-10-CM | POA: Diagnosis not present

## 2024-05-22 DIAGNOSIS — Z7189 Other specified counseling: Secondary | ICD-10-CM | POA: Diagnosis not present

## 2024-05-22 DIAGNOSIS — Z515 Encounter for palliative care: Secondary | ICD-10-CM | POA: Diagnosis not present

## 2024-05-22 DIAGNOSIS — M4302 Spondylolysis, cervical region: Secondary | ICD-10-CM

## 2024-05-22 DIAGNOSIS — R061 Stridor: Secondary | ICD-10-CM | POA: Diagnosis not present

## 2024-05-22 LAB — CBC WITH DIFFERENTIAL/PLATELET
Abs Immature Granulocytes: 0.05 K/uL (ref 0.00–0.07)
Basophils Absolute: 0 K/uL (ref 0.0–0.1)
Basophils Relative: 0 %
Eosinophils Absolute: 0 K/uL (ref 0.0–0.5)
Eosinophils Relative: 0 %
HCT: 41.6 % (ref 39.0–52.0)
Hemoglobin: 13.9 g/dL (ref 13.0–17.0)
Immature Granulocytes: 1 %
Lymphocytes Relative: 4 %
Lymphs Abs: 0.4 K/uL — ABNORMAL LOW (ref 0.7–4.0)
MCH: 30.6 pg (ref 26.0–34.0)
MCHC: 33.4 g/dL (ref 30.0–36.0)
MCV: 91.6 fL (ref 80.0–100.0)
Monocytes Absolute: 0.6 K/uL (ref 0.1–1.0)
Monocytes Relative: 6 %
Neutro Abs: 9.4 K/uL — ABNORMAL HIGH (ref 1.7–7.7)
Neutrophils Relative %: 89 %
Platelets: 313 K/uL (ref 150–400)
RBC: 4.54 MIL/uL (ref 4.22–5.81)
RDW: 14.4 % (ref 11.5–15.5)
WBC: 10.4 K/uL (ref 4.0–10.5)
nRBC: 0 % (ref 0.0–0.2)

## 2024-05-22 LAB — RENAL FUNCTION PANEL
Albumin: 3.2 g/dL — ABNORMAL LOW (ref 3.5–5.0)
Albumin: 3.1 g/dL — ABNORMAL LOW (ref 3.5–5.0)
Anion gap: 14 (ref 5–15)
Anion gap: 16 — ABNORMAL HIGH (ref 5–15)
BUN: 55 mg/dL — ABNORMAL HIGH (ref 8–23)
BUN: 53 mg/dL — ABNORMAL HIGH (ref 8–23)
CO2: 33 mmol/L — ABNORMAL HIGH (ref 22–32)
CO2: 27 mmol/L (ref 22–32)
Calcium: 9.4 mg/dL (ref 8.9–10.3)
Calcium: 9.2 mg/dL (ref 8.9–10.3)
Chloride: 97 mmol/L — ABNORMAL LOW (ref 98–111)
Chloride: 98 mmol/L (ref 98–111)
Creatinine, Ser: 2.09 mg/dL — ABNORMAL HIGH (ref 0.61–1.24)
Creatinine, Ser: 1.65 mg/dL — ABNORMAL HIGH (ref 0.61–1.24)
GFR, Estimated: 31 mL/min — ABNORMAL LOW (ref >60)
GFR, Estimated: 41 mL/min — ABNORMAL LOW (ref >60)
Glucose, Bld: 115 mg/dL — ABNORMAL HIGH (ref 70–99)
Glucose, Bld: 120 mg/dL — ABNORMAL HIGH (ref 70–99)
Phosphorus: 6 mg/dL — ABNORMAL HIGH (ref 2.5–4.6)
Phosphorus: 5.5 mg/dL — ABNORMAL HIGH (ref 2.5–4.6)
Potassium: 4.9 mmol/L (ref 3.5–5.1)
Potassium: 4.3 mmol/L (ref 3.5–5.1)
Sodium: 144 mmol/L (ref 135–145)
Sodium: 141 mmol/L (ref 135–145)

## 2024-05-22 LAB — GLUCOSE, CAPILLARY
Glucose-Capillary: 109 mg/dL — ABNORMAL HIGH (ref 70–99)
Glucose-Capillary: 106 mg/dL — ABNORMAL HIGH (ref 70–99)

## 2024-05-22 LAB — MAGNESIUM
Magnesium: 2.2 mg/dL (ref 1.7–2.4)
Magnesium: 2.2 mg/dL (ref 1.7–2.4)

## 2024-05-22 MED ORDER — LACTATED RINGERS IV SOLN: INTRAVENOUS | Status: AC

## 2024-05-22 MED ORDER — IPRATROPIUM-ALBUTEROL 0.5-2.5 (3) MG/3ML IN SOLN: 3.0000 mL | Freq: Three times a day (TID) | RESPIRATORY_TRACT | Status: DC

## 2024-05-22 MED ORDER — ENSURE PLUS HIGH PROTEIN PO LIQD
237.0000 mL | Freq: Two times a day (BID) | ORAL | Status: DC
  Administered 2024-05-23 (×2): 237 mL via ORAL

## 2024-05-22 MED ORDER — ORAL CARE MOUTH RINSE: 15.0000 mL | OROMUCOSAL | Status: DC | PRN

## 2024-05-22 MED ORDER — AMLODIPINE BESYLATE 5 MG PO TABS
5.0000 mg | ORAL_TABLET | Freq: Every day | ORAL | Status: DC
  Administered 2024-05-22 – 2024-05-23 (×2): 5 mg via ORAL
  Filled 2024-05-22 (×2): qty 1

## 2024-05-22 NOTE — Evaluation (Addendum)
 Clinical/Bedside Swallow Evaluation Patient Details  Name: Samuel Cortez MRN: [id number] Date of Birth: [birth year]  Today's Date: 05/22/2024 Time: SLP Start Time (ACUTE ONLY): 1445 SLP Stop Time (ACUTE ONLY): 1518 SLP Time Calculation (min) (ACUTE ONLY): 33 min  Past Medical History:  Past Medical History:  Diagnosis Date   Gout    Hypertension    Past Surgical History:  Past Surgical History:  Procedure Laterality Date   ANTERIOR CERVICAL DECOMP/DISCECTOMY FUSION N/A 08/31/2018   Procedure: ANTERIOR CERVICAL DECOMPRESSION/DISCECTOMY FUSION CERVICAL FOUR-CERVICAL FIVE;  Surgeon: Unice Pac, MD;  Location: [facility] OR;  Service: Neurosurgery;  Laterality: N/A;  ANTERIOR CERVICAL DECOMPRESSION/DISCECTOMY FUSION CERVICAL FOUR-CERVICAL FIVE   TONSILLECTOMY     HPI:  Samuel Cortez is an 83 yo male presenting to [facility] ED 8/31 with two week history of worsening respiratory status, voice changes, and neck pain. Found to have stridor and transferred to [facility], where he was intubated 9/1-9/2. 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 CT Chest shows faint ground-glass opacities in the RLL and a RLL pulmonary nodule. ENT consulted for laryngoscopy, which appeared grossly similar to outside images from 6 weeks prior, showing edema of the vocal folds and slight hypomobility. MBS February 2024 showed dysphagia with pharyngeal narrowing from the level of the valleculae to the PES, impacting epiglottic inversion and laryngeal elevation with consistent penetration (PAS 2) and esophageal backflow through the PES. At that time, he was compensating by using piecemeal swallows, subswallows, and throat clearance to clear residue though MD notes significant weight loss secondary to persistent dysphagia.    Assessment / Plan / Recommendation  Clinical Impression  Pt is known to this SLP from previous OP MBS February 2024, at which time he was compensating well  for significant pharyngeal residue and penetration. He reports eating and drinking have become even more effortful since then with progressive weight loss. Subjectively, his presentation appears similar to MBS. He takes small bites/sips, swallows multiple times, and clears his throat repeatedly. Offered to assess further with a repeated MBS, which the pt and his family decline. He is very eager to resume a PO diet but his understanding of the risk involved appears to fluctuate, though his wife and stepson were also present and verbalize understanding. Education was provided regarding the risk of aspiration involved with resuming regular diet with thin liquids and they all accept. Discussed with MD, PMT, RD, and RN. Recommend a regular diet and thin liquids with necessary compensatory strategies including throat clearance and subswallows, using discretion to order softer solids and to masticate thoroughly. He has persistent difficulty swallowing pills so it is recommended that they be given crushed in puree. SLP will f/u at least briefly to provide ongoing education with pt and his family.  SLP Visit Diagnosis: Dysphagia, unspecified (R13.10)    Aspiration Risk  Moderate aspiration risk    Diet Recommendation Regular;Thin liquid    Liquid Administration via: Cup;Straw Medication Administration: Crushed with puree Supervision: Staff to assist with self feeding;Full supervision/cueing for compensatory strategies Compensations: Minimize environmental distractions;Slow rate;Small sips/bites Postural Changes: Seated upright at 90 degrees    Other  Recommendations Oral Care Recommendations: Oral care QID     Assistance Recommended at Discharge    Functional Status Assessment Patient has had a recent decline in their functional status and demonstrates the ability to make significant improvements in function in a reasonable and predictable amount of time.  Frequency and Duration  min 2x/week  2 weeks        Prognosis Prognosis for improved oropharyngeal function: Fair Barriers to Reach Goals: Time post onset;Severity of deficits      Swallow Study   General HPI: Samuel Cortez is an 83 yo male presenting to [facility] ED 8/31 with two week history of worsening respiratory status, voice changes, and neck pain. Found to have stridor and transferred to [facility], where he was intubated 9/1-9/2. 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 CT Chest shows faint ground-glass opacities in the RLL and a RLL pulmonary nodule. ENT consulted for laryngoscopy, which appeared grossly similar to outside images from 6 weeks prior, showing edema of the vocal folds and slight hypomobility. MBS February 2024 showed dysphagia with pharyngeal narrowing from the level of the valleculae to the PES, impacting epiglottic inversion and laryngeal elevation with consistent penetration (PAS 2) and esophageal backflow through the PES. At that time, he was compensating by using piecemeal swallows, subswallows, and throat clearance to clear residue though MD notes significant weight loss secondary to persistent dysphagia. Type of Study: Bedside Swallow Evaluation Previous Swallow Assessment: see HPI Diet Prior to this Study: NPO Temperature Spikes Noted: No Respiratory Status: Nasal cannula History of Recent Intubation: Yes Total duration of intubation (days): 2 days Date extubated: 05/21/24 Behavior/Cognition: Alert;Cooperative;Pleasant mood Oral Cavity Assessment: Within Functional Limits Oral Care Completed by SLP: No Oral Cavity - Dentition: Adequate natural dentition Vision: Functional for self-feeding Self-Feeding Abilities: Able to feed self Patient Positioning: Upright in bed Volitional Cough: Weak Volitional Swallow: Able to elicit    Oral/Motor/Sensory Function Overall Oral Motor/Sensory Function: Within functional limits   Ice Chips Ice chips: Not tested   Thin  Liquid Thin Liquid: Impaired Presentation: Straw;Self Fed Pharyngeal  Phase Impairments: Multiple swallows;Wet Vocal Quality;Throat Clearing - Immediate    Nectar Thick Nectar Thick Liquid: Not tested   Honey Thick Honey Thick Liquid: Not tested   Puree Puree: Impaired Presentation: Spoon;Self Fed Pharyngeal Phase Impairments: Multiple swallows;Wet Vocal Quality;Throat Clearing - Immediate   Solid     Solid: Not tested      Damien Blumenthal, M.A., CCC-SLP Speech Language Pathology, Acute Rehabilitation Services  Secure Chat preferred [phone]  05/22/2024,4:26 PM

## 2024-05-22 NOTE — Progress Notes (Signed)
 Palliative:  HPI: 83 y.o. male  with past medical history of weight loss and dysphagia admitted on 05/19/2024 with 2 weeks of worsening breathing, voice changes, and neck pain. Patient initially presented to [facility] ED then transferred to [facility] for admission and potential intubation with diagnosis of stridor. His respiratory status worsened overnight with acute respiratory failure with hypercapnia, shock requiring pressors likely due to sedating medications for intubation. PMT has been consulted to assist with goals of care conversation.   I discussed with Dr. Kassie as well as reviewed notes. I met with Samuel Cortez along with wife Samuel Cortez, her son Samuel Cortez, and his son (or stepson? Samuel Cortez). Family do not necessarily get along well. Samuel Cortez is awake and alert and able to converse. I spent time discussing with Samuel Cortez and family his severe ankylosis of spine that is causing his decline and issues with breathing and swallowing. I reiterated my understanding that his goals are not to be re-intubated if he declines further as he would not desire long term intubation. All confirm this is the goal.   I spent time discussing risk of aspiration and goals. We discussed in detail risk of aspiration and will await follow up with SLP as well. I did discuss that regardless of SLP recommendations that the risk of aspiration will be there. We discussed risk of respiratory decline and that breathing takes much less effort than swallowing. Samuel Cortez tells me that he wants to eat. We discussed aspiration and what this can mean including cough, labored breathing, respiratory decline, pneumonia, and even death. He continues to say that he wants to eat. He asks about cleaning out his lungs if he aspirates. I did explain that there are situations that we can intubate and bronch but with his condition this would not be a good option as this is an expected complication and expected recurrent complication.   From here the conversation  became very difficult. There were questions about tracheostomy. We discussed the specialized care of tracheostomy especially with ventilator support. We discussed that this often means he would be unable to eat or even speak. Often this is more bed-bound status leading to complications of skin breakdown and infections such as UTI and pneumonia. I explained that this is a very difficult path. I explained that he needs to decide if his priority is quality of life or more time. I explained that interventions that may lead to more time does not mean that this time will be good time. Samuel Cortez has a very difficult time reconciling this. He tells me that he wants to live, expresses potential interest in tracheostomy (even though this is not consistent with previous stated goals), but also wants to eat/drink. I tried to explain that we have to choose a path of aggressive care which may include tracheostomy and feeding tube vs doing the best we can but when he declines next we do not pursue aggressive interventions but can offer him comfort to minimize his suffering.   Samuel Cortez spends time having a private conversation with his father. I spoke with Samuel Cortez and Samuel Cortez at this time. Samuel Cortez shares that she has had these conversations with her husband. She shares that he has always shared that he would not want these measures to prolong life artificially. We both agree that we do not feel he truly understands what he is asking and that his expressed goals are inconsistent. At this time Samuel Cortez wishes to continue with plan that we will not re-intubate but provide  comfort if he declines further. She would like for him to eat as desired. We will continue to try and discuss with Samuel Cortez as well.   Samuel Cortez expresses concern that Samuel Cortez may try and override her decisions. We discussed that he is unable to override these decisions as she is the legal surrogate decision maker as his wife. Samuel Cortez shares that she has documented HCPOA - I  encouraged her to bring any paperwork for [facility] as well as Living Will to review and have on file.   All questions/concerns addressed to the best of my ability. Emotional support provided. Discussed with Dr. Kassie, RN, and SLP.   Exam: Awake, alert. Appropriate during conversation. Overall poor insight and understanding of consequences of complex decisions. Breathing regular, unlabored at rest. Abd soft. Generalized weakness.   Plan: - DNR/DNI maintained - no plans for intubation or tracheostomy at this time.  - No plans for feeding tube at this time - Comfort if decline - Will continue conversations with patient to ensure these are his wishes (he is unclear in his wishes today) - these goals are confirmed with wife in the meantime  90 min  Bernarda Kitty, NP Palliative Medicine Team Pager [phone] (Please see [email or web address] for schedule) Team Phone [phone]

## 2024-05-22 NOTE — Plan of Care (Signed)

## 2024-05-22 NOTE — Progress Notes (Signed)
 NAME:  Samuel Cortez, MRN:  [id number], DOB:  September 18, 1941, LOS: 3 ADMISSION DATE:  05/19/2024, CONSULTATION DATE:  05/19/24 REFERRING MD:  EDP, CHIEF COMPLAINT:  SOB   History of Present Illness:  83 year old man w/ hx of weight loss and dysphagia presenting with 2 weeks of worsening breathing, voice changes, and neck pain.  Arrived to [facility] found to have stridor, started on usual meds and sent by ambulance to [facility] for ENT evaluation.  Patient currently some mild hypoxemia and chronic CO2 retention on ABG.  PCCM to admit as patient states he is okay with intubation  if needed.  Pertinent  Medical History    Accompanied by slightly low total protein, discussed the need for protein supplementation, patient in agreement, question whether swallowing difficulties related to abnormal EMG nerve conduction studies.--weight stable since last office visit. reviewed CT of the abdomen and pelvis 07/2023 obtained for weight loss, and change in bowel habits reveals no acute findings masses or enlargement of the lymph nodes. There were is some calcifications and evidence of atherosclerosis in the heart and aorta along with some gallstones without evidence of infection. we will follow-up on renal parameters liver parameters question the need for imaging of the thoracic cavity  08/07/2023: Again significant 11 pound weight loss however patient believes that this is stabled stabilized over the last 2 to 3 weeks, discussed at length the need for high-calorie protein supplements, with her be over-the-counter or homemade mixtures of protein supplements, patient in agreement, discussed his inability to tolerate large amounts of solid food given restrictions with his throat, will follow-up on protein levels today but certainly concerned with other etiologies; Given changes in bowel habits, will obtain CT scan of the abdomen and pelvis. we did discuss previous workup at length including rheumatology workup, ENT to include direct  visualization of the throat, that was benign, speech therapy consult, workup by neurosurgery including Dr. Colon with thought process of encroachment of the cervical spine on the esophagus. However given changes in bowel habits, and continued weight loss despite best efforts, will will obtain abdominal and pelvic CT scan with oral contrast.  04/27/2023: Has lost another 6 pounds discussed, patient believes it is related to his postnasal drip and swallowing difficulties, will monitor. 03/06/2023: Ongoing weight loss, associated with swallowing difficulties, ENT evaluation pending 03/13/2023, has had swallow study as denoted previously, has no issues with solids but is having significant neck discomfort 11/09/2022:Stable with no change from 10/21/2021 ---Unclear etiology, patient does state that he has been eating less but not by intention just does not feel like it. Laboratory evaluation unremarkable, excessive phlegm production is concerning, follow-up chest x-ray, may need ENT consult no lymphadenopathy on exam or B-cell symptoms, we will follow-up in 3 to 4 months. Per patient no additional weight loss in the last 3 months.     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  Significant airway narrowing CT scan of the chest 8/31 faint right basilar groundglass, 4 mm right lower lobe pulmonary nodule, no overt consolidation or abnormality 9/1 >> ET intubated by anesthesia, 6.0 ETT. PMT consulted 9/3 >> Extubated  Interim History / Subjective:  Extubated yesterday. Tolerating 4L [place]. Asking if he is extubated yet. Objective    Blood  pressure (!) 158/77, pulse (!) 59, temperature 97.9 F (36.6 C), temperature source Oral, resp. rate 19,  height 5' 9 (1.753 m), weight 66.2 kg, SpO2 100%.    Vent Mode: CPAP;PSV FiO2 (%):  [40 %] 40 % PEEP:  [5 cmH20] 5 cmH20 Pressure Support:  [10 cmH20] 10 cmH20   Intake/Output Summary (Last 24 hours) at 05/22/2024 0813 Last data filed at 05/22/2024 0400 Gross per 24 hour  Intake 677.68 ml  Output 480 ml  Net 197.68 ml   Filed Weights   05/20/24 0124 05/21/24 0500 05/22/24 0409  Weight: 55.9 kg 57.8 kg 66.2 kg   Physical Exam: General: Chronically ill-appearing, cachectic, no acute distress HENT: , AT, OP clear, MMM Eyes: EOMI, no scleral icterus Respiratory: Clear to auscultation bilaterally.  No crackles, wheezing or rales Cardiovascular: RRR, -M/R/G, no JVD GI: BS+, soft, nontender Extremities:-Edema,-tenderness Neuro: Awake, oriented to self, CNII-XII grossly intact Skin: Intact, no rashes or bruising Psych: Normal mood, normal affect GU: Foley in place  Resolved problem list   Assessment and Plan  Stridor in context of longstanding ill-explained dysphagia symptoms-appears to be related to osteophytes and chronic airway narrowing now with superimposed edema seen on laryngoscopy, question infectious/URI Acute respiratory failure with hypercapnia due to the above -Intubated for airway protection, 6.0 ETT -Full vent support -LTVV, 4-8cc/kg IBW with goal Pplat<30 and DP<15 -SBT. May be candidate for extubation today. -Continue dexamethasone  x 72 hours. Appreciate ENT input -Continue empiric Unasyn  x 3 days, no evidence for abscess, epiglottitis, tonsillitis on CT  Shock, most likely due to sedating medications for intubation. Resolved -Off Norepinephrine   AKI - improving. Poor UOP -Monitor UOP/Cr -Continue mIVF @75cc /h x 12 hours  HTN -Restarted home amlodipine   Goals for care - Confirmed with him that he would not want CPR, would not want significant escalation of care, prolonged ICU care but that he was okay with intubation to try to get over acute upper airway  process -Palliative following. Difficult family dynamic. Will likely need hospice at home vs SNF with hospice as wife unable to support at home.    Labs   CBC: Recent Labs  Lab 05/19/24 1402 05/19/24 1416 05/20/24 0810 05/21/24 0250 05/22/24 0417  WBC 13.2*  --   --  9.3 10.4  NEUTROABS  --   --   --  7.7 9.4*  HGB 16.4 15.6 14.6 13.8 13.9  HCT 47.2 46.0 43.0 40.2 41.6  MCV 89.6  --   --  89.7 91.6  PLT 435*  --   --  438* 313    Basic Metabolic Panel: Recent Labs  Lab 05/19/24 1402 05/19/24 1416 05/20/24 0810 05/21/24 0250 05/21/24 1653 05/22/24 0417  NA 136 135 136 140 143 144  K 3.8 4.9 4.3 3.5 4.4 4.9  CL 93*  --   --  93* 95* 97*  CO2 28  --   --  28 35* 33*  GLUCOSE 131*  --   --  159* 131* 115*  BUN 19  --   --  34* 56* 55*  CREATININE 0.73  --   --  1.97* 2.48* 2.09*  CALCIUM 10.3  --   --  9.3 9.6 9.4  MG  --   --   --   --  2.2 2.2  PHOS  --   --   --   --  5.3* 6.0*   GFR: Estimated Creatinine Clearance: 25.1 mL/min (A) (by C-G formula based on SCr of 2.09 mg/dL (H)).  Recent Labs  Lab 05/19/24 1402 05/21/24 0250 05/22/24 0417  WBC 13.2* 9.3 10.4    Liver Function Tests: Recent Labs  Lab 05/21/24 1653 05/22/24 0417  ALBUMIN  3.3* 3.2*   No results for input(s): LIPASE, AMYLASE in the last 168 hours. No results for input(s): AMMONIA in the last 168 hours.  ABG    Component Value Date/Time   PHART 7.498 (H) 05/20/2024 0810   PCO2ART 46.3 05/20/2024 0810   PO2ART 567 (H) 05/20/2024 0810   HCO3 35.9 (H) 05/20/2024 0810   TCO2 37 (H) 05/20/2024 0810   O2SAT 100 05/20/2024 0810     Coagulation Profile: No results for input(s): INR, PROTIME in the last 168 hours.  Cardiac Enzymes: No results for input(s): CKTOTAL, CKMB, CKMBINDEX, TROPONINI in the last 168 hours.  HbA1C: Hgb A1c MFr Bld  Date/Time Value Ref Range Status  10/11/2023 11:52 AM 5.6 4.8 - 5.6 % Final    Comment:             Prediabetes: 5.7 - 6.4           Diabetes: >6.4          Glycemic control for adults with diabetes: <7.0     CBG: Recent Labs  Lab 05/21/24 1512 05/21/24 1936 05/21/24 2327 05/22/24 0344 05/22/24 0730  GLUCAP 120* 123* 111* 109* 106*     Critical care time: N/A    Care Time: 50 min Dispo: Transfer to floor  Slater Staff, M.D. [facility] Pulmonary/Critical Care Medicine 05/22/2024 8:13 AM   See Amion for personal pager For hours between 7 PM to 7 AM, please call Elink for urgent questions

## 2024-05-23 ENCOUNTER — Inpatient Hospital Stay (HOSPITAL_COMMUNITY)

## 2024-05-23 DIAGNOSIS — Z515 Encounter for palliative care: Secondary | ICD-10-CM | POA: Diagnosis not present

## 2024-05-23 DIAGNOSIS — Z7189 Other specified counseling: Secondary | ICD-10-CM | POA: Diagnosis not present

## 2024-05-23 DIAGNOSIS — M4302 Spondylolysis, cervical region: Secondary | ICD-10-CM | POA: Diagnosis not present

## 2024-05-23 DIAGNOSIS — R061 Stridor: Secondary | ICD-10-CM | POA: Diagnosis not present

## 2024-05-23 DIAGNOSIS — E43 Unspecified severe protein-calorie malnutrition: Secondary | ICD-10-CM | POA: Insufficient documentation

## 2024-05-23 LAB — COMPREHENSIVE METABOLIC PANEL WITH GFR
ALT: 18 U/L (ref 0–44)
AST: 24 U/L (ref 15–41)
Albumin: 2.7 g/dL — ABNORMAL LOW (ref 3.5–5.0)
Alkaline Phosphatase: 16 U/L — ABNORMAL LOW (ref 38–126)
Anion gap: 12 (ref 5–15)
BUN: 47 mg/dL — ABNORMAL HIGH (ref 8–23)
CO2: 28 mmol/L (ref 22–32)
Calcium: 8.5 mg/dL — ABNORMAL LOW (ref 8.9–10.3)
Chloride: 105 mmol/L (ref 98–111)
Creatinine, Ser: 1.44 mg/dL — ABNORMAL HIGH (ref 0.61–1.24)
GFR, Estimated: 48 mL/min — ABNORMAL LOW (ref >60)
Glucose, Bld: 126 mg/dL — ABNORMAL HIGH (ref 70–99)
Potassium: 4.1 mmol/L (ref 3.5–5.1)
Sodium: 145 mmol/L (ref 135–145)
Total Bilirubin: 0.7 mg/dL (ref 0.0–1.2)
Total Protein: 4.8 g/dL — ABNORMAL LOW (ref 6.5–8.1)

## 2024-05-23 LAB — CBC WITH DIFFERENTIAL/PLATELET
Abs Immature Granulocytes: 0.04 K/uL (ref 0.00–0.07)
Basophils Absolute: 0 K/uL (ref 0.0–0.1)
Basophils Relative: 0 %
Eosinophils Absolute: 0 K/uL (ref 0.0–0.5)
Eosinophils Relative: 0 %
HCT: 39.1 % (ref 39.0–52.0)
Hemoglobin: 13 g/dL (ref 13.0–17.0)
Immature Granulocytes: 0 %
Lymphocytes Relative: 10 %
Lymphs Abs: 1.1 K/uL (ref 0.7–4.0)
MCH: 30.5 pg (ref 26.0–34.0)
MCHC: 33.2 g/dL (ref 30.0–36.0)
MCV: 91.8 fL (ref 80.0–100.0)
Monocytes Absolute: 0.9 K/uL (ref 0.1–1.0)
Monocytes Relative: 8 %
Neutro Abs: 9.1 K/uL — ABNORMAL HIGH (ref 1.7–7.7)
Neutrophils Relative %: 82 %
Platelets: 267 K/uL (ref 150–400)
RBC: 4.26 MIL/uL (ref 4.22–5.81)
RDW: 14.3 % (ref 11.5–15.5)
WBC: 11.1 K/uL — ABNORMAL HIGH (ref 4.0–10.5)
nRBC: 0 % (ref 0.0–0.2)

## 2024-05-23 LAB — CBC
HCT: 32.5 % — ABNORMAL LOW (ref 39.0–52.0)
Hemoglobin: 10.5 g/dL — ABNORMAL LOW (ref 13.0–17.0)
MCH: 30.7 pg (ref 26.0–34.0)
MCHC: 32.3 g/dL (ref 30.0–36.0)
MCV: 95 fL (ref 80.0–100.0)
Platelets: 329 K/uL (ref 150–400)
RBC: 3.42 MIL/uL — ABNORMAL LOW (ref 4.22–5.81)
RDW: 14.2 % (ref 11.5–15.5)
WBC: 21.1 K/uL — ABNORMAL HIGH (ref 4.0–10.5)
nRBC: 0 % (ref 0.0–0.2)

## 2024-05-23 LAB — LACTIC ACID, PLASMA: Lactic Acid, Venous: 3.2 mmol/L (ref 0.5–1.9)

## 2024-05-23 LAB — RENAL FUNCTION PANEL
Albumin: 2.8 g/dL — ABNORMAL LOW (ref 3.5–5.0)
Anion gap: 7 (ref 5–15)
BUN: 41 mg/dL — ABNORMAL HIGH (ref 8–23)
CO2: 35 mmol/L — ABNORMAL HIGH (ref 22–32)
Calcium: 8.9 mg/dL (ref 8.9–10.3)
Chloride: 101 mmol/L (ref 98–111)
Creatinine, Ser: 1.28 mg/dL — ABNORMAL HIGH (ref 0.61–1.24)
GFR, Estimated: 56 mL/min — ABNORMAL LOW (ref >60)
Glucose, Bld: 84 mg/dL (ref 70–99)
Phosphorus: 5.2 mg/dL — ABNORMAL HIGH (ref 2.5–4.6)
Potassium: 4.2 mmol/L (ref 3.5–5.1)
Sodium: 143 mmol/L (ref 135–145)

## 2024-05-23 LAB — GLUCOSE, CAPILLARY: Glucose-Capillary: 154 mg/dL — ABNORMAL HIGH (ref 70–99)

## 2024-05-23 LAB — MAGNESIUM: Magnesium: 2.3 mg/dL (ref 1.7–2.4)

## 2024-05-23 MED ORDER — GLYCOPYRROLATE 0.2 MG/ML IJ SOLN
0.1000 mg | Freq: Once | INTRAMUSCULAR | Status: AC
  Administered 2024-05-23: 0.1 mg via INTRAVENOUS
  Filled 2024-05-23: qty 1

## 2024-05-23 MED ORDER — ACETAMINOPHEN 325 MG PO TABS
650.0000 mg | ORAL_TABLET | Freq: Four times a day (QID) | ORAL | Status: DC | PRN
  Administered 2024-05-23: 650 mg via ORAL
  Filled 2024-05-23: qty 2

## 2024-05-23 MED ORDER — LIDOCAINE 5 % EX PTCH
1.0000 | MEDICATED_PATCH | CUTANEOUS | Status: DC
  Administered 2024-05-23: 1 via TRANSDERMAL
  Filled 2024-05-23: qty 1

## 2024-05-23 MED ORDER — MORPHINE SULFATE (PF) 2 MG/ML IV SOLN
2.0000 mg | INTRAVENOUS | Status: DC | PRN
  Administered 2024-05-23: 2 mg via INTRAVENOUS
  Filled 2024-05-23: qty 1

## 2024-05-23 MED ORDER — LORAZEPAM 2 MG/ML IJ SOLN
0.5000 mg | INTRAMUSCULAR | Status: DC | PRN
  Administered 2024-05-24: 0.5 mg via INTRAVENOUS
  Filled 2024-05-23: qty 1

## 2024-05-23 MED ORDER — SODIUM CHLORIDE 0.9 % IV BOLUS
500.0000 mL | Freq: Once | INTRAVENOUS | Status: AC
  Administered 2024-05-23: 500 mL via INTRAVENOUS

## 2024-05-23 MED ORDER — SODIUM CHLORIDE 0.9 % IV BOLUS
1000.0000 mL | Freq: Once | INTRAVENOUS | Status: AC
  Administered 2024-05-23: 1000 mL via INTRAVENOUS

## 2024-05-23 MED ORDER — TRAMADOL HCL 50 MG PO TABS: 50.0000 mg | ORAL_TABLET | Freq: Four times a day (QID) | ORAL | Status: DC | PRN

## 2024-05-23 MED ORDER — ADULT MULTIVITAMIN W/MINERALS CH: 1.0000 | ORAL_TABLET | Freq: Every day | ORAL | Status: DC

## 2024-05-23 MED ORDER — NOREPINEPHRINE 4 MG/250ML-% IV SOLN
0.0000 ug/min | INTRAVENOUS | Status: DC
  Administered 2024-05-23: 2 ug/min via INTRAVENOUS
  Filled 2024-05-23: qty 250

## 2024-05-23 MED ORDER — ALBUMIN HUMAN 5 % IV SOLN
12.5000 g | Freq: Once | INTRAVENOUS | Status: AC
  Administered 2024-05-23: 12.5 g via INTRAVENOUS
  Filled 2024-05-23: qty 250

## 2024-05-23 MED ORDER — SODIUM CHLORIDE 0.9 % IV SOLN
3.0000 g | Freq: Four times a day (QID) | INTRAVENOUS | Status: DC
  Administered 2024-05-23 – 2024-05-24 (×3): 3 g via INTRAVENOUS
  Filled 2024-05-23 (×2): qty 8

## 2024-05-23 MED ORDER — ENSURE PLUS HIGH PROTEIN PO LIQD: 237.0000 mL | Freq: Three times a day (TID) | ORAL | Status: DC

## 2024-05-23 NOTE — Progress Notes (Signed)
 Progress Note   Patient: Samuel Cortez FMW:[id number] DOB: June 15, 1941 DOA: 05/19/2024     4 DOS: the patient was seen and examined on 05/23/2024   Brief hospital course: 83 year old man w/ hx of weight loss and dysphagia presenting with 2 weeks of worsening breathing, voice changes, and neck pain. Arrived to [facility] found to have stridor, started on usual meds and sent by ambulance to [facility] for ENT evaluation.  Was subsequently admitted by PCCM on presentation. 8/31 CT neck showed diffuse ankylosis of the cervical and upper thoracic spine from C2-T5 impacting airway, no mass or fluid collection, no acute findings to suggest epiglottitis or tonsillitis.  Significant airway narrowing CT scan of the chest 8/31 faint right basilar groundglass, 4 mm right lower lobe pulmonary nodule, no overt consolidation or abnormality 9/1 >> ET intubated by anesthesia, 6.0 ETT. PMT consulted 9/3 >> Extubated 05/23/2024 patient transferred to [facility] service for further management  Assessment and Plan:  Stridor in context of longstanding ill-explained dysphagia symptoms-appears to be related to osteophytes and chronic airway narrowing now with superimposed edema seen on laryngoscopy Patient extubated on 05/22/2024 Currently requiring 2 L of intranasal oxygen  Acute respiratory failure with hypercapnia due to the above Extubated on 05/22/2024 -LTVV, 4-8cc/kg IBW with goal Pplat<30 and DP<15 -SBT. May be candidate for extubation today. Completed 72 hours of dexamethasone  -Continue empiric Unasyn , no evidence for abscess, epiglottitis, tonsillitis on CT   Shock, most likely due to sedating medications for intubation. Resolved -Off Norepinephrine  Monitor blood pressure closely   AKI - improving.  We will discontinue Foley catheter for trial of voiding Monitor input and output   HTN -Restarted home amlodipine    Goals for care Patient is DNR -Palliative following. Difficult family dynamic. Will likely need hospice at  home vs SNF with hospice as wife unable to support at home.     Subjective:  Patient seen and examined at bedside this morning Still has some hoarseness of the voice requiring 2 L of oxygen He tells me his respiratory function is much improved Denies chest pain nausea or vomiting  Physical Exam:  General: Chronically ill-appearing, cachectic, no acute distress HENT: [place], AT, OP clear, MMM Eyes: EOMI, no scleral icterus Respiratory: Clear to auscultation bilaterally.  Cardiovascular: RRR, -M/R/G, no JVD GI: BS+, soft, nontender Extremities:-Edema,-tenderness Neuro: Awake, oriented to self, CNII-XII grossly intact Skin: Intact, no rashes or bruising Psych: Normal mood, normal affect  Vitals:   05/22/24 2336 05/23/24 0345 05/23/24 0828 05/23/24 1147  BP: 126/69 129/80 129/63 132/70  Pulse: (!) 58 (!) 54 (!) 57 61  Resp: 16  19 19   Temp: 98.4 F (36.9 C) 97.8 F (36.6 C) 98.7 F (37.1 C) 98 F (36.7 C)  TempSrc: Oral Tympanic    SpO2: 96% 98% 100% 100%  Weight:      Height:        Data Reviewed:    Latest Ref Rng & Units 05/23/2024    7:35 AM 05/22/2024    4:17 AM 05/21/2024    2:50 AM  CBC  WBC 4.0 - 10.5 K/uL 11.1  10.4  9.3   Hemoglobin 13.0 - 17.0 g/dL 86.9  86.0  86.1   Hematocrit 39.0 - 52.0 % 39.1  41.6  40.2   Platelets 150 - 400 K/uL 267  313  438        Latest Ref Rng & Units 05/23/2024    7:35 AM 05/22/2024    6:20 PM 05/22/2024  4:17 AM  BMP  Glucose 70 - 99 mg/dL 84  879  884   BUN 8 - 23 mg/dL 41  53  55   Creatinine 0.61 - 1.24 mg/dL 8.71  8.34  7.90   Sodium 135 - 145 mmol/L 143  141  144   Potassium 3.5 - 5.1 mmol/L 4.2  4.3  4.9   Chloride 98 - 111 mmol/L 101  98  97   CO2 22 - 32 mmol/L 35  27  33   Calcium 8.9 - 10.3 mg/dL 8.9  9.2  9.4      Disposition: Pending clinical course  Author: Drue ONEIDA Potter, MD 05/23/2024 2:33 PM  For on call review [email or web address].

## 2024-05-23 NOTE — Progress Notes (Signed)
 SLP Cancellation Note  Patient Details Name: [name] MRN: [id number] DOB: [birth year]   Cancelled treatment:       Reason Eval/Treat Not Completed: Patient at procedure or test/unavailable (working with OT). SLP will f/u as schedule permits.    Damien Blumenthal, M.A., CCC-SLP Speech Language Pathology, Acute Rehabilitation Services  Secure Chat preferred [phone]  05/23/2024, 5:03 PM

## 2024-05-23 NOTE — Plan of Care (Signed)

## 2024-05-23 NOTE — Significant Event (Addendum)
 Rapid Response Event Note   Reason for Call :  Hypotension during OT evaluation. Patient sat up on edge of bed with OT and then was noted to tense up and become pale. He was lowered back to supine position, BP 57/49.  Initial Focused Assessment:  Pt lying in bed, AO. Pale, warm, dry. No distress. States lightheadedness and dizziness has resolved. Endorses pain to his left hip and back, on-going issue. Heart rate regular, pulses 1+.   VS: BP 81/56, HR 67, RR 22, SpO2 100% on 2LNC  Interventions:  -2L IVF bolus  Plan of Care:  -Pt became hypotension after 1st liter IVF completed (96/66 (76)>>> 66/50 (56)). 2nd liter IVF started.  -q15 min VS  Event Summary:  MD Notified: Dr. Dorinda, at bedside Call Time: 1659 Arrival Time: 1702 End Time: 1915  Leonor LITTIE Danker, RN

## 2024-05-23 NOTE — Evaluation (Signed)
 Occupational Therapy Evaluation Patient Details Name: Samuel Cortez MRN: [id number] DOB: Mar 01, 1941 Today's Date: 05/23/2024   History of Present Illness   83 year old man presenting with 2 weeks of worsening breathing, voice changes, and neck pain. Arrived to [facility] found to have stridor, admitted to ICU, extubated on 05/22/24, w/ hx of weight loss and dysphagia     Clinical Impressions Pt resting in bed, c/o back pain 7/10, agreeable to therapy. Pt live with wife, PLOF mod I with ADLs, ambulates without AD, no history of falls. Pt agreeable to sit up on EOB, sat up for a few seconds with max A but quickly had extensor tone and turned white, still responding to questions, quickly assisted back to supine, Pt responding to questions the entire time. Pt limited significantly due to low BP, BP 57/49, 49/31, 40/31 after multiple attempts supine in bed. RN called, MD informed. RN and NT arrived, multiple BP's taken, Pt continued to respond to questions and only c/o back pain, reports no other symptoms. Pt at times in/out, MD, family, and other staff present. RAPID initiated but established Pt was DNR. Pt BP improved slightly, left with family and staff. Pt's son arrived from [place] at end of sessions. Family and Pt left with staff, all needs met, utilized active listening with wife during session to ensure all needs met.  Will continue to see Pt acutely as needed, DC to postacute rehab <3hrs/day recommended as wife is not able to provide level of assistance needed.      If plan is discharge home, recommend the following:   Two people to help with walking and/or transfers;Two people to help with bathing/dressing/bathroom;Assistance with cooking/housework;Direct supervision/assist for medications management;Direct supervision/assist for financial management;Assist for transportation;Assistance with feeding;Help with stairs or ramp for entrance     Functional Status Assessment   Patient has had a recent  decline in their functional status and demonstrates the ability to make significant improvements in function in a reasonable and predictable amount of time.     Equipment Recommendations   None recommended by OT     Recommendations for Other Services         Precautions/Restrictions   Precautions Precautions: Fall Recall of Precautions/Restrictions: Intact Restrictions Weight Bearing Restrictions Per Provider Order: No     Mobility Bed Mobility Overal bed mobility: Needs Assistance Bed Mobility: Supine to Sit, Sit to Supine, Rolling Rolling: Min assist   Supine to sit: Total assist, +2 for physical assistance Sit to supine: Total assist, +2 for physical assistance   General bed mobility comments: total for assist to EOB, able to roll min A    Transfers                   General transfer comment: total A, bed level      Balance                                           ADL either performed or assessed with clinical judgement   ADL Overall ADL's : Needs assistance/impaired Eating/Feeding: Moderate assistance;Sitting   Grooming: Moderate assistance   Upper Body Bathing: Total assistance;Bed level   Lower Body Bathing: Total assistance;Bed level   Upper Body Dressing : Total assistance;Bed level   Lower Body Dressing: Total assistance;Bed level  General ADL Comments: Pt bed level, with significantly low BP total A for dressing/bathing     Vision Baseline Vision/History: 1 Wears glasses Ability to See in Adequate Light: 0 Adequate       Perception         Praxis         Pertinent Vitals/Pain Pain Assessment Pain Assessment: 0-10 Pain Score: 8  Pain Location: back Pain Descriptors / Indicators: Constant, Grimacing Pain Intervention(s): Limited activity within patient's tolerance, Monitored during session     Extremity/Trunk Assessment Upper Extremity Assessment Upper Extremity Assessment:  Overall WFL for tasks assessed           Communication Communication Communication: Impaired Factors Affecting Communication: Hearing impaired;Reduced clarity of speech   Cognition Arousal: Alert Behavior During Therapy: Restless Cognition: No apparent impairments             OT - Cognition Comments: Pt A/Ox4, HOH                 Following commands: Intact       Cueing  General Comments   Cueing Techniques: Verbal cues  BP 57/49, 49/31, 40/31 after multiple attempts supine in bed. RN called, MD informed   Exercises     Shoulder Instructions      Home Living Family/patient expects to be discharged to:: Private residence Living Arrangements: Spouse/significant other Available Help at Discharge: Family;Available 24 hours/day Type of Home: House Home Access: Stairs to enter [employer] of Steps: 1 Entrance Stairs-Rails: None Home Layout: One level     Bathroom Shower/Tub: Walk-in shower         Home Equipment: Agricultural consultant (2 wheels);Cane - single point;Shower seat;Wheelchair - manual   Additional Comments: Pt lives with wife who can help with cooking/cleaning and simple ADLs      Prior Functioning/Environment Prior Level of Function : Independent/Modified Independent             Mobility Comments: ind, sometimes uses cane, no recent falls ADLs Comments: ind    OT Problem List: Decreased strength;Decreased range of motion;Decreased activity tolerance;Impaired balance (sitting and/or standing);Decreased safety awareness;Impaired UE functional use;Pain   OT Treatment/Interventions: Self-care/ADL training;Therapeutic exercise;Energy conservation;DME and/or AE instruction;Therapeutic activities;Patient/family education;Balance training      OT Goals(Current goals can be found in the care plan section)   Acute Rehab OT Goals Patient Stated Goal: comfort OT Goal Formulation: With patient/family Time For Goal Achievement:  06/06/24 Potential to Achieve Goals: Good   OT Frequency:  Min 2X/week    Co-evaluation              AM-PAC OT 6 Clicks Daily Activity     Outcome Measure Help from another person eating meals?: A Lot Help from another person taking care of personal grooming?: A Lot Help from another person toileting, which includes using toliet, bedpan, or urinal?: Total Help from another person bathing (including washing, rinsing, drying)?: Total Help from another person to put on and taking off regular upper body clothing?: Total Help from another person to put on and taking off regular lower body clothing?: Total 6 Click Score: 8   End of Session Nurse Communication: Mobility status;Other (comment) (RN informed low BP)  Activity Tolerance: Treatment limited secondary to medical complications (Comment) (low BP) Patient left: in bed;with call bell/phone within reach;with nursing/sitter in room;with family/visitor present  OT Visit Diagnosis: Unsteadiness on feet (R26.81);Other abnormalities of gait and mobility (R26.89);Muscle weakness (generalized) (M62.81);History of falling (Z91.81);Pain Pain - part of body:  (  back)                Time: 8382-8285 OT Time Calculation (min): 57 min Charges:  OT General Charges $OT Visit: 1 Visit OT Evaluation $OT Eval High Complexity: 1 High OT Treatments $Self Care/Home Management : 23-37 mins $Therapeutic Activity: 8-22 mins  Memorie Yokoyama, OTR/L   Kourtnee Lahey R Axelle Szwed 05/23/2024, 5:20 PM

## 2024-05-23 NOTE — Progress Notes (Signed)
Incentive spirometry introduced with teach back 

## 2024-05-23 NOTE — Progress Notes (Signed)
 PHARMACY NOTE:  ANTIMICROBIAL RENAL DOSAGE ADJUSTMENT  Current antimicrobial regimen includes a mismatch between antimicrobial dosage and estimated renal function.  As per policy approved by the Pharmacy & Therapeutics and Medical Executive Committees, the antimicrobial dosage will be adjusted accordingly.  Current antimicrobial dosage:  unasyn  3g IV q 12 hr  Indication: aspiration PNA  Renal Function:  Estimated Creatinine Clearance: 40.9 mL/min (A) (by C-G formula based on SCr of 1.28 mg/dL (H)). []      On intermittent HD, scheduled: []      On CRRT    Antimicrobial dosage has been changed to:  unasyn  3g IV q6 hr to complete course   Additional comments: Kept end date for tomorrow    Thank you for allowing pharmacy to participate in this patient's care,  Suzen Sour, PharmD, BCCCP Clinical Pharmacist  Phone: [phone] 05/23/2024 1:35 PM  Please check AMION for all [facility] Pharmacy phone numbers After 10:00 PM, call Main Pharmacy [phone]

## 2024-05-23 NOTE — Progress Notes (Signed)
 Patient very restless c/o pain. Used flacc scale and pt given morphine  and zofran  for comfort. Pt resting quietly with family at bedside

## 2024-05-23 NOTE — TOC Progression Note (Signed)
 Transition of Care [facility]) - Progression Note    Patient Details  Name: Samuel Cortez MRN: [id number] Date of Birth: 1941/01/07  [facility] CM/SW Contact  Rosaline JONELLE Joe, RN Phone Number: 05/23/2024, 2:45 PM  Clinical Narrative:    CM noted that Palliative Care team continues to follow the patient with Goals of Care discussion with patient and family.  Per notes - wife is unable to support the patient in the home.  Patient may likely need SNF placement.                     Expected Discharge Plan and Services                                               Social Drivers of Health (SDOH) Interventions SDOH Screenings   Food Insecurity: No Food Insecurity (05/19/2024)  Housing: Low Risk  (05/19/2024)  Transportation Needs: No Transportation Needs (05/19/2024)  Utilities: Not At Risk (05/19/2024)  Social Connections: Unknown (05/19/2024)  Tobacco Use: Medium Risk (05/19/2024)    Readmission Risk Interventions     No data to display

## 2024-05-23 NOTE — Progress Notes (Signed)
 RN was notified by the Occupational Therapist that patient has had a decline in his functional status and mental status. CNA was taking vitals. RN noted his BP getting drop and made the provider aware. While RN was assessing the patient , he had same episode of syncopy and became unresponsive for seconds.  Notified CN. Both provider and CN at bed side.  Patient was declining with mental status again. Called the Rapid. Normal saline bolus given. Patient is alert and oriented.  Family at bed side. RN and Rapid RN at bed side. Will continue to monitor through this shift.

## 2024-05-23 NOTE — Progress Notes (Signed)
   05/23/24 2223  Spiritual Encounters  Type of Visit Attempt (pt unavailable)  Referral source Code page  OnCall Visit Yes   Responded to rapid response request. Chaplain not needed.

## 2024-05-23 NOTE — Progress Notes (Signed)
 Palliative:  HPI: 83 y.o. male  with past medical history of weight loss and dysphagia admitted on 05/19/2024 with 2 weeks of worsening breathing, voice changes, and neck pain. Patient initially presented to [facility] then transferred to [facility] for admission and potential intubation with diagnosis of stridor. His respiratory status worsened overnight with acute respiratory failure with hypercapnia, shock requiring pressors likely due to sedating medications for intubation. PMT has been consulted to assist with goals of care conversation.   I met today with Mr. Samuel Cortez and wife, [name], at bedside. Both are in good spirits. Lunch is at bedside - he did not eat much. Enjoys Ensure. We discussed taking his time, small bites, chewing well to enable safe intake as much as possible. We discussed small frequent meals and snacks and Ensure between meals to increase overall caloric intake as eating big meals will be difficult and exhausting. [name] is motivated to move forward to getting out of the hospital. We discussed PT/OT as next step and order placed. [name] is enjoying his intake and being able to eat/drink. We agreed to not worry about feeding tube and re-intubation and to enjoy letting his body do what it is able to do for as long as possible. All agree with plan. We did discuss the potential need for SNF rehab but will await recommendations from PT/OT. He would be a good candidate for hospice at home if home is recommended. I did provide them with MOST form to review and discuss - not completed at this time but encouraged completion prior to leaving the hospital.   [name] and [name] have been married almost 9 years. 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 [name] was a Financial controller for [employer] prior to having her children although they never knew each other at this time. [name] plans to provide us  a copy of HCPOA/Living Will as well.   All questions/concerns addressed. Emotional support provided.    Exam: Alert, more oriented but still with some confusion regarding his condition and complex decisions. Breathing regular, unlabored. Abd soft, flat. Moves all extremities.   Plan: - DNR/DNI maintained - No plans for feeding tube - he wishes to eat/drink - Comfort if decline - PT/OT evaluations - SNF rehab with palliative vs home with hospice - Reviewing MOST form  50 min  Bernarda Kitty, NP Palliative Medicine Team Pager [phone] (Please see [email or web address] for schedule) Team Phone [phone]

## 2024-05-23 NOTE — Progress Notes (Signed)
 Cross covering ICU physician.   Called to eval pt for worsening hypotension. After chart review, noted many  notes from providers and palliative care that should pt decline again that he and his family have opted for comfort measures.   I spoke with pt and wife at bedside and explained his decompensating BP. Family is all enroute back to hospital. Will cont IVF for the time being as well as low dose pressor until family arrives. Will place comfort orders. Give temporizing dose of albumin  as well. Add prn pain med and anxiolytic for comfort.   No indication for transfer to ICU in light of this.

## 2024-05-23 NOTE — Significant Event (Signed)
 Rapid Response Event Note   Pt received 2L IVF on day shift. Once IVF rate 999 stopped, pt's BP dropped to 60s and he became pale and began to c/o dizziness and a headache. Dr. Alfornia notified, levophed  gtt started, and PCCM consulted.  Dr. Layman, PCCM MD to bedside.  DNR/DNI confirmed. Vasopressor support as well as pt situation discussed with pt and wife at bedside. Pt changed to comfort care. Levophed  continued until pt's son joined pt's wife at bedside. Levophed  then titratred off(2109). Please call RRT if further assistance needed.    Tish Graeme Piety, RN

## 2024-05-23 NOTE — Progress Notes (Signed)
 Nutrition Follow-up  DOCUMENTATION CODES:   Severe malnutrition in context of chronic illness  INTERVENTION:  -Continue regular menu diet  Regular texture, thin liquids per SLP -Increase Ensure Plus High Protein TID as pt likes ONS -Staff to continue to encourage PO intake as tolerated  Per palliative notes, pt/family wishes for no nutrition support, limited interventions, no repeat intubation.  NUTRITION DIAGNOSIS:   Severe Malnutrition related to chronic illness, dysphagia as evidenced by severe fat depletion, severe muscle depletion, percent weight loss.  Ongoing  GOAL:   Patient will meet greater than or equal to 90% of their needs  Not met  MONITOR:   Vent status, Labs, Weight trends  REASON FOR ASSESSMENT:   Consult, Ventilator Enteral/tube feeding initiation and management  ASSESSMENT:   Pt admitted with c/o weight loss, dysphagia and 2 weeks of worsening breathing, voice changes and neck pain. PMH significant for spinal cord injury (2019).  8/31 CT showed significant airway narrowing, 4 mm pulmonary nodule, right basilar ground glass.  9/1 Intubated 9/3 Extubated 9/4 On 4L Nettie, on regular diet with known risk for aspiration; declines MBS. GOC ongoing.  No noted n/v/d. Last BM unknown. Pt with diet upgraded yesterday to regular menu, regular texture, thin liquids diet per SLP, with known risk of aspiration. MBS recommended by ST, pt/family declined. Pt/family agreeable to continuing PO intake, no nutrition support, no repeat intubation. Per palliative notes, pt enjoys ensure ONS, will increase to TID. She did also encourage slow pace, small frequent meals, adequate chewing as well. The pt has been happy with ability to have PO intake, hopes to discharge soon. Plan per palliative potentially SNF if needed, or home with potential hospice services. Will continue to monitor, RDN available prn.   Labs 84-106 BUN 41 Cr 1.28 Phos 5.2 Albumin  2.8 GFR  56  Medications  amLODipine   5 mg Oral Daily   Chlorhexidine  Gluconate Cloth  6 each Topical Daily   feeding supplement  237 mL Oral TID BM   heparin  injection (subcutaneous)  5,000 Units Subcutaneous Q8H   latanoprost   1 drop Both Eyes QHS   Racepinephrine HCl  0.5 mL Nebulization Once     NUTRITION - FOCUSED PHYSICAL EXAM:  Flowsheet Row Most Recent Value  Orbital Region Severe depletion  Upper Arm Region Severe depletion  Thoracic and Lumbar Region Severe depletion  Buccal Region Severe depletion  Temple Region Severe depletion  Clavicle Bone Region Severe depletion  Clavicle and Acromion Bone Region Severe depletion  Scapular Bone Region Severe depletion  Dorsal Hand Severe depletion  Patellar Region Severe depletion  Anterior Thigh Region Severe depletion  Posterior Calf Region Severe depletion  Edema (RD Assessment) None  Hair Reviewed  Eyes Reviewed  Mouth Unable to assess  Skin Reviewed  Nails Reviewed    Diet Order:   Diet Order             Diet regular Room service appropriate? Yes with Assist; Fluid consistency: Thin  Diet effective now                   EDUCATION NEEDS:   Not appropriate for education at this time  Skin:  Skin Assessment: Reviewed RN Assessment  Last BM:  unknown  Height:   Ht Readings from Last 1 Encounters:  05/19/24 5' 9 (1.753 m)    Weight:   Wt Readings from Last 1 Encounters:  05/22/24 66.2 kg    BMI:  Body mass index is 21.55 kg/m.  Estimated  Nutritional Needs:   Kcal:  1600-1800  Protein:  80-95g  Fluid:  >/=1.6L  Sarajean Dessert Daml-Budig, RDN, LDN Registered Dietitian Nutritionist RD Inpatient Contact Info in [place]

## 2024-05-24 DIAGNOSIS — R061 Stridor: Secondary | ICD-10-CM | POA: Diagnosis not present

## 2024-05-24 MED ORDER — SODIUM CHLORIDE 0.9 % IV SOLN
12.5000 mg | Freq: Once | INTRAVENOUS | Status: AC
  Administered 2024-05-24: 12.5 mg via INTRAVENOUS
  Filled 2024-05-24: qty 12.5

## 2024-06-19 NOTE — Progress Notes (Signed)
 PT Cancellation Note  Patient Details Name: Samuel Cortez MRN: [id number] DOB: 09-11-1941   Cancelled Treatment:    Reason Eval/Treat Not Completed: (P) Other (comment). Per chart, pt and family have decided for pt to pursue comfort measures. No PT needs identified at this time. Please re-consult if needed or POC changes.   Theo Ferretti, PT, DPT Acute Rehabilitation Services  Office: [phone]    Theo CHRISTELLA Ferretti 28-May-2024, 7:36 AM

## 2024-06-19 NOTE — Death Summary Note (Signed)
 DEATH SUMMARY   Patient Details  Name: Samuel Cortez MRN: [id number] DOB: 02/09/1941 ERE:JccjSearcy, MD Admission/Discharge Information   Admit Date:  05-20-2024  Date of Death: Date of Death: 05-25-2024  Time of Death: Time of Death: 0740  Length of Stay: 5   Principle Cause of death: Airway narrowing   Hospital Diagnoses:  Stridor in context of longstanding ill-explained dysphagia symptoms Acute respiratory failure with hypercapnia due to the above Shock AKI   HTN  Brief hospital course: 83 year old man w/ hx of weight loss and dysphagia presenting with 2 weeks of worsening breathing, voice changes, and neck pain. Arrived to [place] found to have stridor, started on usual meds and sent by ambulance to [facility] for ENT evaluation.  Was subsequently admitted by PCCM on presentation. 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  Significant airway narrowing CT scan of the chest May 20, 2024 faint right basilar groundglass, 4 mm right lower lobe pulmonary nodule, no overt consolidation or abnormality 9/1 >> ET intubated by anesthesia, 6.0 ETT. PMT consulted 9/3 >> Extubated 9/4 >> Patient transferred to TRH service for further management 9/4 >> Patient became more hypotensive. After discussion with PCCM, family and patient elected to transition to full comfort measures          The results of significant diagnostics from this hospitalization (including imaging, microbiology, ancillary and laboratory) are listed below for reference.   Significant Diagnostic Studies: DG CHEST PORT 1 VIEW Result Date: 05/23/2024 CLINICAL DATA:  Hypotension EXAM: PORTABLE CHEST 1 VIEW COMPARISON:  Chest x-ray 05/20/2024 FINDINGS: The heart size and mediastinal contours are within normal limits. Both lungs are clear. The visualized skeletal structures are  unremarkable. IMPRESSION: No active disease. Electronically Signed   By: Greig Pique M.D.   On: 05/23/2024 20:18   DG Abd 1 View Result Date: 05/20/2024 CLINICAL DATA:  NG tube placement EXAM: ABDOMEN - 1 VIEW COMPARISON:  None Available. FINDINGS: Enteric tube with tip in side hole projecting over the left upper quadrant over the region of the body of the stomach. Nonobstructive bowel gas pattern. Vascular calcifications. No acute osseous findings. IMPRESSION: Enteric tube in place with tip projecting over the body of the stomach. Electronically Signed   By: Michaeline Blanch M.D.   On: 05/20/2024 11:58   DG CHEST PORT 1 VIEW Result Date: 05/20/2024 CLINICAL DATA:  NG tube placement. EXAM: PORTABLE CHEST 1 VIEW COMPARISON:  The 10/19/2023 FINDINGS: Endotracheal tube tip is approximately 4.7 cm above the base of the carina. NG tube tip is in the proximal stomach with side port positioned in the distal esophagus. Lungs are hyperexpanded. The lungs are clear without focal pneumonia, edema, pneumothorax or pleural effusion. The cardiopericardial silhouette is within normal limits for size. No acute bony abnormality. Telemetry leads overlie the chest. IMPRESSION: 1. Endotracheal tube tip is approximately 4.7 cm above the base of the carina. 2. NG tube tip is in the proximal stomach with side port positioned in the distal esophagus. NG tube could be advanced about 8 cm to place the side port below the GE junction as clinically warranted. Repeat x-ray after repositioning recommended. Electronically Signed   By: Camellia Candle M.D.   On: 05/20/2024 07:49   CT SOFT TISSUE NECK WO CONTRAST Result Date: 2024-05-20 EXAM: CT NECK WITHOUT CONTRAST 05/20/24 05:29:22 PM TECHNIQUE: CT of the  neck was performed without the administration of intravenous contrast. Multiplanar reformatted images are provided for review. Automated exposure control, iterative reconstruction, and/or weight based adjustment of the mA/kV was utilized  to reduce the radiation dose to as low as reasonably achievable. COMPARISON: CT head without contrast 03/23/2023. CLINICAL HISTORY: Epiglottitis or tonsillitis suspected. FINDINGS: AERODIGESTIVE TRACT: No discrete mass. No edema. SALIVARY GLANDS: The parotid and submandibular glands are unremarkable. THYROID : Unremarkable. LYMPH NODES: No suspicious cervical lymphadenopathy. SOFT TISSUES: No mass or fluid collection. BRAIN, ORBITS, SINUSES AND MASTOIDS: No acute abnormality. LUNGS AND MEDIASTINUM: No acute abnormality. BONES: The cervical and upper thoracic spine demonstrate diffuse ankylosis from C2 to at least T4-5. IMPRESSION: 1. No acute findings of epiglottitis or tonsillitis. 2. 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 Electronically signed by: Lonni Necessary MD 05/19/2024 05:49 PM EDT RP Workstation: [id number]   CT CHEST WO CONTRAST Result Date: 05/19/2024 CLINICAL DATA:  Provided history: Pneumonia, complication suspected, xray done Shortness of breath. EXAM: CT CHEST WITHOUT CONTRAST TECHNIQUE: Multidetector CT imaging of the chest was performed following the standard protocol without IV contrast. RADIATION DOSE REDUCTION: This exam was performed according to the departmental dose-optimization program which includes automated exposure control, adjustment of the mA and/or kV according to patient size and/or use of iterative reconstruction technique. COMPARISON:  Radiograph earlier today FINDINGS: Cardiovascular: The heart is normal in size. Coronary artery calcifications. No pericardial effusion. Dilated ascending aorta at 4 cm. Moderate aortic atherosclerosis. No periaortic stranding. Mediastinum/Nodes: No enlarged mediastinal lymph nodes. The esophagus is decompressed. 11 mm right thyroid  nodule. Not clinically significant; no follow-up imaging recommended (ref: J Am Coll Radiol. 2015 Feb;12(2): 143-50). Lungs/Pleura: No confluent airspace disease. Faint  ground-glass opacities in the right lower lobe series 5, image 93. Small calcified granuloma in the periphery of the left upper lobe approaching the fissure. There is an adjacent dilated bronchiole. 4 mm right lower lobe nodule series 5, image 54. No features of pulmonary edema. No pleural fluid. Upper Abdomen: Abdominal aortic atherosclerosis. No acute upper abdominal findings. Musculoskeletal: Diffuse thoracic spondylosis. There are no acute or suspicious osseous abnormalities. IMPRESSION: 1. Faint ground-glass opacities in the right lower lobe, likely infectious or inflammatory. 2. A 4 mm right lower lobe pulmonary nodule. No follow-up needed if patient is low-risk.This recommendation follows the consensus statement: Guidelines for Management of Incidental Pulmonary Nodules Detected on CT Images: From the Fleischner Society 2017; Radiology 2017; 284:228-243. 3. Coronary artery calcifications. Aortic Atherosclerosis (ICD10-I70.0). 4. Dilated ascending aorta at 4 cm. Recommend annual imaging followup by CTA or MRA. This recommendation follows 2010 ACCF/AHA/AATS/ACR/ASA/SCA/SCAI/SIR/STS/SVM Guidelines for the Diagnosis and Management of Patients with Thoracic Aortic Disease. Circulation. 2010; 121: Z733-z630. Aortic aneurysm NOS (ICD10-I71.9) Electronically Signed   By: Andrea Gasman M.D.   On: 05/19/2024 17:46   DG Chest Port 1 View Result Date: 05/19/2024 CLINICAL DATA:  Shortness of breath. EXAM: PORTABLE CHEST 1 VIEW COMPARISON:  08/30/2018 and CT chest 12/02/2005. FINDINGS: Trachea is midline. Heart size normal. Lungs are clear. No pleural fluid. IMPRESSION: No acute findings. Electronically Signed   By: Newell Eke M.D.   On: 05/19/2024 14:23    Microbiology: Recent Results (from the past 240 hours)  MRSA Next Gen by PCR, Nasal     Status: None   Collection Time: 05/19/24  5:30 PM  Result Value Ref Range Status   MRSA by PCR Next Gen NOT DETECTED NOT DETECTED Final    Comment: (NOTE) The  GeneXpert MRSA Assay (FDA approved for NASAL specimens only), is one component of a comprehensive MRSA colonization surveillance program. It is not intended to diagnose MRSA infection nor to guide or monitor treatment for MRSA infections. Test performance is not FDA approved in patients less than 57 years old. Performed at [facility], [address], [place], [place] [address]      Signed: Delon Hoe, DO June 21, 2024

## 2024-06-19 NOTE — Plan of Care (Signed)
 Patient is a DNR and was changed to comfort care on 05/23/24.  Patient being made comfortable and family educated and agreeable. Problem: Education: Goal: Knowledge of General Education information will improve Description: Including pain rating scale, medication(s)/side effects and non-pharmacologic comfort measures 06-18-2024 0451 by Finley Query, RN Outcome: Not Applicable 06-18-24 0447 by Finley Query, RN Outcome: Progressing   Problem: Health Behavior/Discharge Planning: Goal: Ability to manage health-related needs will improve 06/18/2024 0451 by Finley Query, RN Outcome: Not Applicable 06/18/24 0447 by Finley Query, RN Outcome: Progressing   Problem: Clinical Measurements: Goal: Ability to maintain clinical measurements within normal limits will improve 2024-06-18 0451 by Finley Query, RN Outcome: Not Applicable 06-18-2024 0447 by Finley Query, RN Outcome: Progressing Goal: Will remain free from infection 2024-06-18 0451 by Finley Query, RN Outcome: Not Applicable 18-Jun-2024 0447 by Finley Query, RN Outcome: Progressing Goal: Diagnostic test results will improve 06/18/24 0451 by Finley Query, RN Outcome: Not Applicable 06-18-24 0447 by Finley Query, RN Outcome: Progressing Goal: Respiratory complications will improve 2024/06/18 0451 by Finley Query, RN Outcome: Not Applicable 18-Jun-2024 0447 by Finley Query, RN Outcome: Progressing Goal: Cardiovascular complication will be avoided 06/18/24 0451 by Finley Query, RN Outcome: Not Applicable Jun 18, 2024 0447 by Finley Query, RN Outcome: Progressing   Problem: Activity: Goal: Risk for activity intolerance will decrease 2024/06/18 0451 by Finley Query, RN Outcome: Not Applicable 06-18-2024 0447 by Finley Query, RN Outcome: Progressing   Problem: Nutrition: Goal: Adequate nutrition will be maintained 06/18/2024 0451 by Finley Query, RN Outcome: Not Applicable June 18, 2024 0447 by Finley Query, RN Outcome: Progressing   Problem: Coping: Goal: Level of anxiety will decrease 06/18/2024 0451 by Finley Query, RN Outcome: Not Applicable 06-18-2024 0447 by Finley Query, RN Outcome: Progressing   Problem: Elimination: Goal: Will not experience complications related to bowel motility June 18, 2024 0451 by Finley Query, RN Outcome: Not Applicable 06-18-2024 0447 by Finley Query, RN Outcome: Progressing Goal: Will not experience complications related to urinary retention 06-18-24 0451 by Finley Query, RN Outcome: Not Applicable Jun 18, 2024 0447 by Finley Query, RN Outcome: Progressing   Problem: Pain Managment: Goal: General experience of comfort will improve and/or be controlled June 18, 2024 0451 by Finley Query, RN Outcome: Not Applicable 06-18-24 0447 by Finley Query, RN Outcome: Progressing   Problem: Safety: Goal: Ability to remain free from injury will improve 2024/06/18 0451 by Finley Query, RN Outcome: Not Applicable 06-18-24 0447 by Finley Query, RN Outcome: Progressing   Problem: Skin Integrity: Goal: Risk for impaired skin integrity will decrease June 18, 2024 0451 by Finley Query, RN Outcome: Not Applicable 18-Jun-2024 0447 by Finley Query, RN Outcome: Progressing

## 2024-06-19 NOTE — Progress Notes (Signed)
 SLP Cancellation Note  Patient Details Name: Samuel Cortez MRN: [id number] DOB: 1941/05/22   Cancelled treatment:       Reason Eval/Treat Not Completed: Pt and his family have decided for pt to pursue comfort measures. They previously verbalized understanding of all education and pt demonstrated independence with using compensatory strategies. No further SLP needs identified at this time. Please re-consult if acute needs arise.    Damien Blumenthal, M.A., CCC-SLP Speech Language Pathology, Acute Rehabilitation Services  Secure Chat preferred [phone]  06/04/24, 9:00 AM

## 2024-06-19 DEATH — deceased

## 2024-07-16 ENCOUNTER — Ambulatory Visit (INDEPENDENT_AMBULATORY_CARE_PROVIDER_SITE_OTHER): Payer: BLUE CROSS/BLUE SHIELD | Admitting: Otolaryngology
